# Patient Record
Sex: Female | Born: 1970 | Race: White | Hispanic: Yes | Marital: Married | Smoking: Never smoker
Health system: Southern US, Community
[De-identification: ages and names within clinical notes are randomized; demographics above are authoritative.]

## PROBLEM LIST (undated history)

## (undated) DIAGNOSIS — T7840XA Allergy, unspecified, initial encounter: Secondary | ICD-10-CM

## (undated) HISTORY — DX: Allergy, unspecified, initial encounter: T78.40XA

---

## 2009-05-11 ENCOUNTER — Inpatient Hospital Stay (HOSPITAL_COMMUNITY): Admission: AD | Admit: 2009-05-11 | Discharge: 2009-05-11 | Payer: Self-pay | Admitting: Obstetrics & Gynecology

## 2009-07-08 ENCOUNTER — Ambulatory Visit (HOSPITAL_COMMUNITY): Admission: RE | Admit: 2009-07-08 | Discharge: 2009-07-08 | Payer: Self-pay | Admitting: Obstetrics & Gynecology

## 2009-11-27 ENCOUNTER — Inpatient Hospital Stay (HOSPITAL_COMMUNITY): Admission: AD | Admit: 2009-11-27 | Discharge: 2009-11-27 | Payer: Self-pay | Admitting: Obstetrics & Gynecology

## 2009-11-27 ENCOUNTER — Ambulatory Visit: Payer: Self-pay | Admitting: Obstetrics and Gynecology

## 2009-12-01 ENCOUNTER — Inpatient Hospital Stay (HOSPITAL_COMMUNITY)
Admission: AD | Admit: 2009-12-01 | Discharge: 2009-12-03 | Payer: Self-pay | Source: Home / Self Care | Admitting: Obstetrics & Gynecology

## 2009-12-01 ENCOUNTER — Ambulatory Visit: Payer: Self-pay | Admitting: Family Medicine

## 2010-06-30 LAB — CBC
HCT: 31.1 % — ABNORMAL LOW (ref 36.0–46.0)
HCT: 40 % (ref 36.0–46.0)
Hemoglobin: 10.5 g/dL — ABNORMAL LOW (ref 12.0–15.0)
MCH: 29.9 pg (ref 26.0–34.0)
MCH: 30.5 pg (ref 26.0–34.0)
MCHC: 33.3 g/dL (ref 30.0–36.0)
MCV: 90.2 fL (ref 78.0–100.0)
RBC: 4.46 MIL/uL (ref 3.87–5.11)
RDW: 15.4 % (ref 11.5–15.5)
RDW: 15.8 % — ABNORMAL HIGH (ref 11.5–15.5)
WBC: 12 10*3/uL — ABNORMAL HIGH (ref 4.0–10.5)

## 2010-07-03 LAB — GC/CHLAMYDIA PROBE AMP, GENITAL: Chlamydia, DNA Probe: NEGATIVE

## 2010-07-03 LAB — POCT PREGNANCY, URINE: Preg Test, Ur: POSITIVE

## 2010-07-03 LAB — URINE MICROSCOPIC-ADD ON

## 2010-07-03 LAB — ABO/RH: ABO/RH(D): A POS

## 2010-07-03 LAB — WET PREP, GENITAL: Trich, Wet Prep: NONE SEEN

## 2010-07-03 LAB — CBC
Platelets: 237 10*3/uL (ref 150–400)
WBC: 11.4 10*3/uL — ABNORMAL HIGH (ref 4.0–10.5)

## 2010-07-03 LAB — URINALYSIS, ROUTINE W REFLEX MICROSCOPIC
Ketones, ur: NEGATIVE mg/dL
Nitrite: NEGATIVE
Protein, ur: NEGATIVE mg/dL

## 2013-07-02 ENCOUNTER — Ambulatory Visit: Payer: Self-pay | Admitting: Gynecology

## 2013-09-25 ENCOUNTER — Other Ambulatory Visit (HOSPITAL_COMMUNITY): Payer: Self-pay | Admitting: Physician Assistant

## 2013-09-25 DIAGNOSIS — Z1231 Encounter for screening mammogram for malignant neoplasm of breast: Secondary | ICD-10-CM

## 2013-10-05 ENCOUNTER — Ambulatory Visit (HOSPITAL_COMMUNITY)
Admission: RE | Admit: 2013-10-05 | Discharge: 2013-10-05 | Disposition: A | Discharge status: Home / Self Care | Payer: Self-pay | Source: Ambulatory Visit | Attending: Physician Assistant | Admitting: Physician Assistant

## 2013-10-05 DIAGNOSIS — Z1231 Encounter for screening mammogram for malignant neoplasm of breast: Secondary | ICD-10-CM

## 2014-12-09 ENCOUNTER — Other Ambulatory Visit: Payer: Self-pay | Admitting: Obstetrics and Gynecology

## 2014-12-09 DIAGNOSIS — Z1231 Encounter for screening mammogram for malignant neoplasm of breast: Secondary | ICD-10-CM

## 2014-12-30 ENCOUNTER — Encounter (HOSPITAL_COMMUNITY): Payer: Self-pay

## 2014-12-30 ENCOUNTER — Ambulatory Visit (HOSPITAL_COMMUNITY)
Admission: RE | Admit: 2014-12-30 | Discharge: 2014-12-30 | Disposition: A | Discharge status: Home / Self Care | Payer: Self-pay | Source: Ambulatory Visit | Attending: Obstetrics and Gynecology | Admitting: Obstetrics and Gynecology

## 2014-12-30 VITALS — BP 120/72 | Temp 98.2°F | Ht 60.0 in | Wt 190.0 lb

## 2014-12-30 DIAGNOSIS — Z1231 Encounter for screening mammogram for malignant neoplasm of breast: Secondary | ICD-10-CM

## 2014-12-30 DIAGNOSIS — Z1239 Encounter for other screening for malignant neoplasm of breast: Secondary | ICD-10-CM

## 2014-12-30 NOTE — Progress Notes (Signed)
CLINIC:  [facility] Civil engineer, contracting) Clinic  REASON FOR VISIT: Well-woman exam and screening mammogram.    HISTORY OF PRESENT ILLNESS:  Ms. Lynn Aguirre is a 44 y.o. female who presents to the [facility], [place] today for clinical breast exam. No family history of breast cancer. She has no complaints today.  Her last pap smear was in 09/2013 and was negative.  She has no history of abnormal pap smears.   REVIEW OF SYSTEMS:  Denies any breast pain, nodularity, nipple inversion, or nipple discharge bilaterally.   ALLERGIES: No Known Allergies  CURRENT MEDICATIONS:  No current outpatient prescriptions on file prior to encounter.   No current facility-administered medications on file prior to encounter.     PHYSICAL EXAM:  Vitals:  Filed Vitals:   12/30/14 1417  BP: 120/72  Temp: 98.2 F (36.8 C)   General: Well-nourished, well-appearing female in no acute distress.  She is unaccompanied in clinic today.  Stoney Bang, LPN and Milas Gain, Spanish language interpreter were present during physical exam for this patient.  Breasts: Bilateral breasts exposed and observed with patient standing (arms at side, arms on hips, arms on hips flexed forward, and arms over head).  No gross abnormalities including breast skin puckering or dimpling noted on observation.  Breasts symmetrical without evidence of skin redness, thickening, or peau d'orange appearance. No nipple retraction or nipple discharge noted bilaterally.  No breast nodularity palpated in bilateral breasts.  Normal fibrocystic breast changes noted in bilateral breasts. Axillary lymph nodes: No axillary lymphadenopathy bilaterally.   GU: Exam deferred. Pap smear is up-to-date.  ASSESSMENT & PLAN:   1. Breast cancer screening: Ms. Lynn Aguirre has no palpable breast abnormalities on her clinical breast exam today.  She will receive her screening mammogram as scheduled.  She will be contacted by the imaging center for  results of the mammogram, either by letter or phone within the next few weeks.  She was given instructions and educational materials regarding breast self-awareness. Ms. Lynn Aguirre is aware of this plan and agrees with it.    Ms. Lynn Aguirre was encouraged to ask questions and all questions were answered to her satisfaction.    Lubertha Basque, NP [facility]  [phone]

## 2015-01-20 ENCOUNTER — Ambulatory Visit: Payer: Self-pay

## 2015-02-28 ENCOUNTER — Emergency Department (HOSPITAL_COMMUNITY)
Admission: EM | Admit: 2015-02-28 | Discharge: 2015-03-01 | Disposition: A | Discharge status: Home / Self Care | Payer: 59 | Attending: Emergency Medicine | Admitting: Emergency Medicine

## 2015-02-28 ENCOUNTER — Encounter (HOSPITAL_COMMUNITY): Payer: Self-pay | Admitting: *Deleted

## 2015-02-28 ENCOUNTER — Emergency Department (HOSPITAL_COMMUNITY): Payer: 59

## 2015-02-28 DIAGNOSIS — J029 Acute pharyngitis, unspecified: Secondary | ICD-10-CM

## 2015-02-28 DIAGNOSIS — J069 Acute upper respiratory infection, unspecified: Secondary | ICD-10-CM | POA: Diagnosis not present

## 2015-02-28 LAB — RAPID STREP SCREEN (MED CTR MEBANE ONLY): STREPTOCOCCUS, GROUP A SCREEN (DIRECT): NEGATIVE

## 2015-02-28 MED ORDER — HYDROCODONE-ACETAMINOPHEN 7.5-325 MG/15ML PO SOLN: 15.0000 mL | Freq: Four times a day (QID) | ORAL | Status: AC | PRN

## 2015-02-28 NOTE — ED Notes (Signed)
Pt left with all her belongings and ambulated out of the treatment area.  

## 2015-02-28 NOTE — Discharge Instructions (Signed)
Faringitis (Pharyngitis) La faringitis ocurre cuando la faringe presenta enrojecimiento, dolor e hinchazn (inflamacin).  CAUSAS  Normalmente, la faringitis se debe a una infeccin. Generalmente, estas infecciones ocurren debido a virus (viral) y se presentan cuando las personas se resfran. Sin embargo, a Advertising account executive faringitis es provocada por bacterias (bacteriana). Las alergias tambin pueden ser una causa de la faringitis. La faringitis viral se puede contagiar de Neomia Dear persona a otra al toser, estornudar y compartir objetos o utensilios personales (tazas, tenedores, cucharas, cepillos de diente). La faringitis bacteriana se puede contagiar de Neomia Dear persona a otra a travs de un contacto ms ntimo, como besar.  SIGNOS Y SNTOMAS  Los sntomas de la faringitis incluyen los siguientes:   Dolor de Advertising copywriter.  Cansancio (fatiga).  Fiebre no muy elevada.  Dolor de [place].  Dolores musculares y en las articulaciones.  Erupciones cutneas  Ganglios linfticos hinchados.  Una pelcula parecida a las placas en la garganta o las amgdalas (frecuente con la faringitis bacteriana). DIAGNSTICO  El mdico le har preguntas sobre la enfermedad y sus sntomas. Normalmente, todo lo que se necesita para diagnosticar una faringitis son sus antecedentes mdicos y un examen fsico. A veces se realiza una prueba rpida para estreptococos. Tambin es posible que se realicen otros anlisis de laboratorio, segn la posible causa.  TRATAMIENTO  La faringitis viral normalmente mejorar en un plazo de 3 a 4das sin medicamentos. La faringitis bacteriana se trata con medicamentos que [employer] grmenes (antibiticos).  INSTRUCCIONES PARA EL CUIDADO EN EL HOGAR   Beba gran cantidad de lquido para mantener la orina de tono claro o color amarillo plido.  Tome solo medicamentos de venta libre o recetados, segn las indicaciones del mdico.  Si le receta antibiticos, asegrese de terminarlos, incluso si comienza  a Actor.  No tome aspirina.  Descanse lo suficiente.  Hgase grgaras con 8onzas ( ) de agua con sal (cucharadita de sal por litro de agua) cada 1 o 2horas para Science writer.  Puede usar pastillas (si no corre riesgo de Health visitor) o aerosoles para Science writer. SOLICITE ATENCIN MDICA SI:   Tiene bultos grandes y dolorosos en el cuello.  Tiene una erupcin cutnea.  Cuando tose elimina una expectoracin verde, amarillo amarronado o con Aneta. SOLICITE ATENCIN MDICA DE INMEDIATO SI:   El cuello se pone rgido.  Comienza a babear o no puede tragar lquidos.  Vomita o no puede retener los [employer] lquidos.  Siente un dolor intenso que no se alivia con los medicamentos recomendados.  Tiene dificultades para respirar (y no debido a la nariz tapada). ASEGRESE DE QUE:   Comprende estas instrucciones.  Controlar su afeccin.  Recibir ayuda de inmediato si no mejora o si empeora.   Esta informacin no tiene Theme park manager el consejo del mdico. Asegrese de hacerle al mdico cualquier pregunta que tenga.   Document Released: 01/10/2005 Document Revised: 01/21/2013 Elsevier Interactive Patient Education 2016 [employer].  Tos en los adultos (Cough, Adult) La tos es un reflejo que limpia la garganta y las vas respiratorias, y ayuda a la curacin y Training and development officer de los pulmones. Es normal toser de Teacher, English as a foreign language, pero cuando esta se presenta con otros sntomas o dura mucho tiempo puede ser el signo de una enfermedad que [place]. La tos puede durar solo 2 o 3semanas (aguda) o ms de 8semanas (crnica). CAUSAS Comnmente, las causas de la tos son las siguientes:  Visual merchandiser sustancias que [employer].  Una infeccin  respiratoria viral o bacteriana.  Alergias.  Asma.  Goteo posnasal.  Fumar.  El retroceso de cido estomacal hacia el esfago (reflujo gastroesofgico).  Algunos medicamentos.  Los  problemas pulmonares crnicos, entre ellos, la enfermedad pulmonar obstructiva crnica (EPOC) (o, en contadas ocasiones, el cncer de pulmn).  Otras afecciones, como la insuficiencia cardaca. INSTRUCCIONES PARA EL CUIDADO EN EL HOGAR  Est atento a cualquier cambio en los sntomas. Tome estas medidas para Paramedic las molestias:  Tome los medicamentos solamente como se lo haya indicado el mdico.  Si le recetaron un antibitico, tmelo como se lo haya indicado el mdico. No deje de tomar los antibiticos aunque comience a sentirse mejor.  Hable con el mdico antes de tomar un antitusivo.  Beba suficiente lquido para Photographer orina clara o de color amarillo plido.  Si el aire est seco, use un vaporizador o un humidificador con vapor fro en su habitacin o en su casa para ayudar a aflojar las secreciones.  Evite todas las cosas que le producen tos en el trabajo o en su casa.  Si la tos aumenta durante la noche, intente dormir semisentado.  Evite el humo del cigarrillo. Si fuma, deje de hacerlo. Si necesita ayuda para dejar de fumar, consulte al mdico.  Evite la cafena.  Evite el alcohol.  Descanse todo lo que sea necesario. SOLICITE ATENCIN MDICA SI:   Aparecen nuevos sntomas.  Expectora pus al toser.  La tos no mejora despus de 2 o 3semanas, o empeora.  No puede controlar la tos con antitusivos y no puede dormir bien.  Tiene un dolor que se intensifica o que no puede Sales promotion account executive.  Tiene fiebre.  Baja de peso sin causa aparente.  Tiene transpiracin nocturna. SOLICITE ATENCIN MDICA DE INMEDIATO SI:  Tose y escupe sangre.  Tiene dificultad para respirar.  Los latidos cardacos son muy rpidos.   Esta informacin no tiene Theme park manager el consejo del mdico. Asegrese de hacerle al mdico cualquier pregunta que tenga.   Document Released: 11/08/2010 Document Revised: 12/22/2014 Elsevier Interactive Patient Education 2016  [employer].  Dolor de garganta  (Sore Throat)  El dolor de garganta es el dolor, ardor, irritacin o sensacin de picazn en la garganta. Generalmente hay dolor o molestias al tragar o hablar. Un dolor de garganta puede estar acompaado de otros sntomas, como tos, estornudos, fiebre y ganglios hinchados en el cuello. Generalmente es Financial risk analyst signo de otra enfermedad, como un resfrio, gripe, anginas o mononucleosis (conocida como mono). La mayor parte de los dolores de garganta desaparecen sin tratamiento mdico. CAUSAS  Las causas ms comunes de dolor de garganta son:   Infecciones virales, como un resfrio, gripe o mononucleosis.  Infeccin bacteriana, como faringitis estreptoccica, amigdalitis, o tos ferina.  Alergias estacionales.  La sequedad en el aire.  Algunos irritantes, como el humo o la polucin.  Reflujo gastroesofgico. INSTRUCCIONES PARA EL CUIDADO EN EL HOGAR   Tome slo la medicacin que le indic el mdico.  Debe ingerir gran cantidad de lquido para mantener la orina de tono claro o color amarillo plido.  Descanse todo lo que sea necesario.  Trate de usar [employer] para la garganta, pastillas o chupe caramelos duros para Engineer, materials (si es mayor de 4 aos o segn lo que le indiquen).  Beba lquidos calientes, como caldos, infusiones de hierbas o agua caliente con miel para calmar el dolor momentneamente. Tambin puede comer o beber lquidos fros o congelados tales como paletas de hielo congelado.  Haga grgaras con agua con sal (mezclar 1 cucharadita de sal en 8 onzas [250 cm3] de agua).  No fume, y evite el humo de otros fumadores.  Ponga un humidificador de vapor fro en la habitacin por la noche para humedecer el aire. Tambin se puede activar en una ducha de agua caliente y sentarse en el bao con la puerta cerrada durante 5-10 minutos. SOLICITE ATENCIN MDICA DE INMEDIATO SI:   Tiene dificultad para respirar.  No puede tragar lquidos,  alimentos blandos, o su saliva.  Usted tiene ms inflamacin en la garganta.  El dolor de garganta no mejora en [address].  Tiene nuseas o vmitos.  Tiene fiebre o sntomas que persisten durante ms de 2 o 3 das.  Tiene fiebre y los sntomas empeoran de manera sbita. ASEGRESE DE QUE:   Comprende estas instrucciones.  Controlar su enfermedad.  Solicitar ayuda de inmediato si no mejora o si empeora.   Esta informacin no tiene Theme park manager el consejo del mdico. Asegrese de hacerle al mdico cualquier pregunta que tenga.   Document Released: 04/02/2005 Document Revised: 03/19/2012 Elsevier Interactive Patient Education 2016 [employer].  Infeccin del tracto respiratorio superior, adultos (Upper Respiratory Infection, Adult) La mayora de las infecciones del tracto respiratorio superior son infecciones virales de las vas que llevan el aire a los pulmones. Un infeccin del tracto respiratorio superior afecta la nariz, la garganta y las vas respiratorias superiores. El tipo ms frecuente de infeccin del tracto respiratorio superior es la nasofaringitis, que habitualmente se conoce como "resfro comn". Las infecciones del tracto respiratorio superior siguen su curso y por lo general se curan solas. En la [employer], la infeccin del tracto respiratorio superior no requiere atencin [place], Biomedical engineer a veces, despus de una infeccin viral, puede surgir una infeccin bacteriana en las vas respiratorias superiores. Esto se conoce como infeccin secundaria. Las infecciones sinusales y en el odo medio son tipos frecuentes de infecciones secundarias en el tracto respiratorio superior. La neumona bacteriana tambin puede complicar un cuadro de infeccin del tracto respiratorio superior. Este tipo de infeccin puede empeorar el asma y la enfermedad pulmonar obstructiva crnica (EPOC). En algunos casos, estas complicaciones pueden requerir atencin mdica de emergencia y poner  en peligro la vida.  CAUSAS Casi todas las infecciones del tracto respiratorio superior se deben a los virus. Un virus es un tipo de microbio que puede contagiarse de Neomia Dear persona a Educational psychologist.  FACTORES DE RIESGO Puede estar en riesgo de sufrir una infeccin del tracto respiratorio superior si:   Fuma.  Tiene una enfermedad pulmonar o cardaca crnica.  Tiene debilitado el sistema de defensa (inmunitario) del cuerpo.  Es [address] o de edad muy [place].  Tiene asma o alergias nasales.  Trabaja en reas donde hay mucha gente o poca ventilacin.  Rudi Coco en una escuela o en un centro de atencin mdica. SIGNOS Y SNTOMAS  Habitualmente, los sntomas aparecen de 2a 3das despus de entrar en contacto con el virus del resfro. La mayora de las infecciones virales en el tracto respiratorio superior duran de 7a 10das. Sin embargo, las infecciones virales en el tracto respiratorio superior a causa del virus de la gripe pueden durar de 14a 18das y, habitualmente, son ms graves. Entre los sntomas se pueden incluir los siguientes:   Secrecin o congestin nasal.  Estornudos.  Tos.  Dolor de Advertising copywriter.  Dolor de [place].  Fatiga.  Grant Ruts.  Prdida del apetito.  Dolor en la frente, detrs de los ojos  y por encima de los pmulos (dolor sinusal).  Dolores musculares. DIAGNSTICO  El mdico puede diagnosticar una infeccin del tracto respiratorio superior mediante los siguientes estudios:  Examen fsico.  Pruebas para verificar si los sntomas no se deben a otra afeccin, por ejemplo:  Faringitis estreptoccica.  Sinusitis.  Neumona.  Asma. TRATAMIENTO  Esta infeccin desaparece sola, con el tiempo. No puede curarse con medicamentos, pero a menudo se prescriben para aliviar los sntomas. Los medicamentos pueden ser tiles para lo siguiente:  Personal assistant fiebre.  Reducir la tos.  Aliviar la congestin nasal. INSTRUCCIONES PARA EL CUIDADO EN EL HOGAR   Tome los  medicamentos solamente como se lo haya indicado el mdico.  A fin de Engineer, materials de garganta, haga grgaras con solucin salina templada o consuma caramelos para la tos, como se lo haya indicado el mdico.  Use un humidificador de vapor clido o inhale el vapor de la ducha para aumentar la humedad del aire. Esto facilitar la respiracin.  Beba suficiente lquido para Photographer orina clara o de color amarillo plido.  Consuma sopas y otros caldos transparentes, y [employer].  Descanse todo lo que sea necesario.  Regrese al Aleen Campi cuando la temperatura se le haya normalizado o cuando el mdico lo autorice. Es posible que deba quedarse en su casa durante un tiempo prolongado, para no infectar a los dems. Tambin puede usar un barbijo y lavarse las manos con cuidado para Transport planner propagacin del virus.  Aumente el uso del inhalador si tiene asma.  No consuma ningn producto que contenga tabaco, lo que incluye cigarrillos, tabaco de Theatre manager o Administrator, Civil Service. Si necesita ayuda para dejar de fumar, consulte al [employer]. PREVENCIN  La mejor manera de protegerse de un resfro es mantener una higiene [place].   Evite el contacto oral o fsico con personas que tengan sntomas de resfro.  En caso de contacto, lvese las manos con frecuencia. No hay pruebas claras de que la vitaminaC, la vitaminaE, la equincea o el ejercicio reduzcan la probabilidad de Primary school teacher un resfro. Sin embargo, siempre se recomienda Insurance account manager, hacer ejercicio y Engineering geologist.  SOLICITE ATENCIN MDICA SI:   Su estado empeora en lugar de mejorar.  Los medicamentos no Estate agent.  Tiene escalofros.  La sensacin de falta de aire empeora.  Tiene mucosidad marrn o roja.  Tiene secrecin nasal amarilla o marrn.  Le duele la cara, especialmente al inclinarse hacia adelante.  Tiene fiebre.  Tiene los ganglios del cuello hinchados.  Siente dolor al  tragar.  Tiene zonas blancas en la parte de atrs de la garganta. SOLICITE ATENCIN MDICA DE INMEDIATO SI:   Tiene sntomas intensos o persistentes de:  Dolor de [place].  Dolor de odos.  Dolor sinusal.  Dolor en el pecho.  Tiene enfermedad pulmonar crnica y cualquiera de estos sntomas:  Sibilancias.  Tos prolongada.  Tos con sangre.  Cambio en la mucosidad habitual.  Presenta rigidez en el cuello.  Tiene cambios en:  La visin.  La audicin.  El pensamiento.  El [place] de nimo. ASEGRESE DE QUE:   Comprende estas instrucciones.  Controlar su afeccin.  Recibir ayuda de inmediato si no mejora o si empeora.   Esta informacin no tiene Theme park manager el consejo del mdico. Asegrese de hacerle al mdico cualquier pregunta que tenga.  ESTABLISH CARE WITH A PRIMARY CARE PROVIDER. RETURN TO THE EMERGENCY DEPARTMENT IF YOU EXPERIENCE WORSENING OF YOUR SYMPTOMS, SEVERE HEADACHE, DIFFICULTY BREATHING OR SWALLOWING, VOMITING,  FEVER.

## 2015-02-28 NOTE — ED Notes (Signed)
Since last nite developed fever and started coughing.  Pt states it was making it hard for her to breath.  Pt states throat is hurting.  Pt states her ears are clogged up.

## 2015-03-02 LAB — CULTURE, GROUP A STREP: STREP A CULTURE: NEGATIVE

## 2015-03-03 NOTE — ED Provider Notes (Signed)
CSN: [id number]     Arrival date & time 02/28/15  1727 History   First MD Initiated Contact with Patient 02/28/15 2118     Chief Complaint  Patient presents with  . Cough  . Sore Throat     (Consider location/radiation/quality/duration/timing/severity/associated sxs/prior Treatment) Patient is a 44 y.o. female presenting with cough and pharyngitis. The history is provided by the patient and a relative. The history is limited by a language barrier (Patient's son translated for patient).  Cough Associated symptoms: rhinorrhea   Sore Throat Associated symptoms include coughing.     Lynn Aguirre is a 44 y.o F with no significant pmhx who presents to the Ed today c/o subjective fevers, sore throat, cough onset last night. Patient states that she has not taken her temperature at home but feels that she is warm to the touch. Patient also complaining of sore throat. No difficulty swallowing. However, when she coughs the pain in her throat is increased. Cough is nonproductive. No difficulty breathing or shortness of breath. Denies vomiting, diarrhea, chills, dizziness, syncope, chest pain, headache.  History reviewed. No pertinent past medical history. Past Surgical History  Procedure Laterality Date  . Cesarean section     Family History  Problem Relation Age of Onset  . Diabetes Father    Social History  Substance Use Topics  . Smoking status: Never Smoker   . Smokeless tobacco: Never Used  . Alcohol Use: No   OB History    Gravida Para Term Preterm AB TAB SAB Ectopic Multiple Living   4 4 4       4      Review of Systems  HENT: Positive for rhinorrhea. Negative for facial swelling, mouth sores, postnasal drip, sinus pressure and voice change.   Respiratory: Positive for cough.   All other systems reviewed and are negative.     Allergies  Review of patient's allergies indicates no known allergies.  Home Medications   Prior to Admission medications   Medication Sig  Start Date End Date Taking? Authorizing Provider  loratadine (CLARITIN) 10 MG tablet Take 10 mg by mouth daily as needed for allergies.   Yes Historical Provider, MD  HYDROcodone-acetaminophen (HYCET) 7.5-325 mg/15 ml solution Take 15 mLs by mouth 4 (four) times daily as needed for moderate pain. 02/28/15 02/28/16  Estreya Clay Tripp Bronislaw Switzer, PA-C   BP 115/79 mmHg  Pulse 87  Temp(Src) 99 F (37.2 C) (Oral)  Resp 14  SpO2 96% Physical Exam  Constitutional: She is oriented to person, place, and time. She appears well-developed and well-nourished. No distress.  HENT:  Head: Normocephalic and atraumatic.  Nose: Nose normal.  Mouth/Throat: Oropharynx is clear and moist. No oropharyngeal exudate.  TMs clear bilaterally.  Eyes: Conjunctivae and EOM are normal. Pupils are equal, round, and reactive to light. Right eye exhibits no discharge. Left eye exhibits no discharge. No scleral icterus.  Neck: Neck supple.  Cardiovascular: Normal rate, regular rhythm, normal heart sounds and intact distal pulses.  Exam reveals no gallop and no friction rub.   No murmur heard. Pulmonary/Chest: Effort normal and breath sounds normal. No stridor. No respiratory distress. She has no wheezes. She has no rales. She exhibits no tenderness.  Abdominal: Soft. She exhibits no distension. There is no tenderness. There is no rebound and no guarding.  Musculoskeletal: Normal range of motion. She exhibits no edema.  Lymphadenopathy:    She has no cervical adenopathy.  Neurological: She is alert and oriented to person, place, and time. No  cranial nerve deficit.  Strength 5/5 throughout. No sensory deficits.    Skin: Skin is warm and dry. No rash noted. She is not diaphoretic. No erythema. No pallor.  Psychiatric: She has a normal mood and affect. Her behavior is normal.  Nursing note and vitals reviewed.   ED Course  Procedures (including critical care time) Labs Review Labs Reviewed  RAPID STREP SCREEN (NOT AT [facility])   CULTURE, GROUP A STREP    Imaging Review No results found. I have personally reviewed and evaluated these images and lab results as part of my medical decision-making.   EKG Interpretation None      MDM   Final diagnoses:  Viral pharyngitis  Viral URI    Otherwise healthy 44 year old female Presents for 1 day history of sore throat, cough, subjective fevers. Vital signs stable in the emergency department. Afebrile. Rapid strep screen negative. Does not meet any Centor criteria. Chest x-ray negative for infection. Lungs clear to auscultation bilaterally. Patient able to eat and drink appropriately. No difficulty swallowing or breathing. Suspect viral URI/viral pharyngitis. Recommend symptomatic treatment including rest, hydration, NSAIDs as needed for pain. Patient given strict return precautions which are outlined in the discharge instructions.  Encourage patient to establish care with a primary care physician. Patient has insurance coverage.     Lester Kinsman Ingalls, PA-C 03/03/15 1610  Mancel Bale, MD 03/03/15 [phone]

## 2015-03-14 ENCOUNTER — Ambulatory Visit: Payer: Self-pay | Admitting: Family Medicine

## 2015-09-19 ENCOUNTER — Ambulatory Visit (INDEPENDENT_AMBULATORY_CARE_PROVIDER_SITE_OTHER): Payer: 59 | Admitting: Physician Assistant

## 2015-09-19 VITALS — BP 122/76 | HR 74 | Temp 98.3°F | Resp 16 | Ht 60.0 in | Wt 200.0 lb

## 2015-09-19 DIAGNOSIS — J069 Acute upper respiratory infection, unspecified: Secondary | ICD-10-CM

## 2015-09-19 MED ORDER — IBUPROFEN 600 MG PO TABS: 600.0000 mg | ORAL_TABLET | Freq: Three times a day (TID) | ORAL | Status: DC | PRN

## 2015-09-19 MED ORDER — CETIRIZINE-PSEUDOEPHEDRINE ER 5-120 MG PO TB12: 1.0000 | ORAL_TABLET | Freq: Two times a day (BID) | ORAL | Status: DC

## 2015-09-19 MED ORDER — HYDROCODONE-HOMATROPINE 5-1.5 MG/5ML PO SYRP: 2.5000 mL | ORAL_SOLUTION | Freq: Every evening | ORAL | Status: DC | PRN

## 2015-09-19 NOTE — Progress Notes (Signed)
09/19/2015 8:27 PM   DOB: [birth year] / MRN: [id number]  SUBJECTIVE:  Lynn Aguirre is a 45 y.o. female presenting for sneezing, coughing, itchy and watery eyes that started 3 days ago.  She associates dry and scratchy throat. Denies fever, chills and nausea.  She is not getting better or worse.  She has tried benadryl and theraflu, both offered fair relief.   She has No Known Allergies.   She  has no past medical history on file.    She  reports that she has never smoked. She has never used smokeless tobacco. She reports that she does not drink alcohol or use illicit drugs. She  reports that she currently engages in sexual activity. She reports using the following methods of birth control/protection: None and Condom. The patient  has past surgical history that includes Cesarean section.  Her family history includes Diabetes in her father.  Review of Systems  Constitutional: Negative for fever and chills.  Eyes: Negative for blurred vision.  Respiratory: Negative for cough and shortness of breath.   Cardiovascular: Negative for chest pain.  Gastrointestinal: Negative for nausea and abdominal pain.  Genitourinary: Negative for dysuria, urgency and frequency.  Musculoskeletal: Negative for myalgias.  Skin: Negative for rash.  Neurological: Negative for dizziness, tingling and headaches.  Psychiatric/Behavioral: Negative for depression. The patient is not nervous/anxious.     Problem list and medications reviewed and updated by myself where necessary, and exist elsewhere in the encounter.   OBJECTIVE:  BP 122/76 mmHg  Pulse 74  Temp(Src) 98.3 F (36.8 C) (Oral)  Resp 16  Ht 5' (1.524 m)  Wt 200 lb (90.719 kg)  BMI 39.06 kg/m2  SpO2 97%  LMP 09/13/2015  Physical Exam  Constitutional: She is oriented to person, place, and time. She appears well-nourished. No distress.  HENT:  Nose: Mucosal edema (beefy red) present. No rhinorrhea. Right sinus exhibits no maxillary sinus  tenderness and no frontal sinus tenderness. Left sinus exhibits no maxillary sinus tenderness and no frontal sinus tenderness.  Eyes: EOM are normal. Pupils are equal, round, and reactive to light.  Cardiovascular: Normal rate and regular rhythm.  Exam reveals no friction rub.   No murmur heard. Pulmonary/Chest: Effort normal. No respiratory distress. She has no wheezes.  Abdominal: She exhibits no distension.  Neurological: She is alert and oriented to person, place, and time. No cranial nerve deficit. Gait normal.  Skin: Skin is dry. She is not diaphoretic.  Psychiatric: She has a normal mood and affect.  Vitals reviewed.   No results found for this or any previous visit (from the past 72 hour(s)).  No results found.  ASSESSMENT AND PLAN  Crystalle was seen today for cough, ear pain, sinusitis and sore throat.  Diagnoses and all orders for this visit:  URI (upper respiratory infection): HPI and exam consistent with a viral URI. Will treat to that end.   -     cetirizine-pseudoephedrine (ZYRTEC-D ALLERGY & CONGESTION) 5-120 MG tablet; Take 1 tablet by mouth 2 (two) times daily. -     ibuprofen (ADVIL,MOTRIN) 600 MG tablet; Take 1 tablet (600 mg total) by mouth every 8 (eight) hours as needed. -     HYDROcodone-homatropine (HYCODAN) 5-1.5 MG/5ML syrup; Take 2.5-5 mLs by mouth at bedtime as needed.    The patient was advised to call or return to clinic if she does not see an improvement in symptoms or to seek the care of the closest emergency department if she worsens with  the above plan.   Deliah BostonMichael Terence Bart, MHS, PA-C [facility] 09/19/2015 8:27 PM

## 2015-09-19 NOTE — Patient Instructions (Signed)
     IF you received an x-ray today, you will receive an invoice from [facility]. Please contact [facility] at [phone] with questions or concerns regarding your invoice.   IF you received labwork today, you will receive an invoice from [facility]. Please contact [facility] at [phone] with questions or concerns regarding your invoice.   Our billing staff will not be able to assist you with questions regarding bills from these companies.  You will be contacted with the lab results as soon as they are available. The fastest way to get your results is to activate your My Chart account. Instructions are located on the last page of this paperwork. If you have not heard from us regarding the results in 2 weeks, please contact this office.      

## 2015-12-16 ENCOUNTER — Other Ambulatory Visit: Payer: Self-pay | Admitting: Physician Assistant

## 2015-12-16 DIAGNOSIS — Z1231 Encounter for screening mammogram for malignant neoplasm of breast: Secondary | ICD-10-CM

## 2016-01-06 ENCOUNTER — Ambulatory Visit
Admission: RE | Admit: 2016-01-06 | Discharge: 2016-01-06 | Disposition: A | Discharge status: Home / Self Care | Payer: 59 | Source: Ambulatory Visit | Attending: Physician Assistant | Admitting: Physician Assistant

## 2016-01-06 DIAGNOSIS — Z1231 Encounter for screening mammogram for malignant neoplasm of breast: Secondary | ICD-10-CM

## 2016-07-13 ENCOUNTER — Ambulatory Visit (INDEPENDENT_AMBULATORY_CARE_PROVIDER_SITE_OTHER): Payer: 59 | Admitting: Family Medicine

## 2016-07-13 VITALS — BP 126/80 | HR 77 | Temp 98.1°F | Resp 16 | Ht 60.0 in | Wt 203.0 lb

## 2016-07-13 DIAGNOSIS — B86 Scabies: Secondary | ICD-10-CM | POA: Diagnosis not present

## 2016-07-13 MED ORDER — PERMETHRIN 5 % EX CREA: 1.0000 "application " | TOPICAL_CREAM | Freq: Once | CUTANEOUS | 0 refills | Status: DC

## 2016-07-13 NOTE — Progress Notes (Signed)
Chief Complaint  Patient presents with  . Rash    itchy all over x 3 wks, took son to Dr. on 07/12/16 diag with scabies    HPI New Problem- Rash Patient reports an itchy rash for 3 weeks She took her son to the doctor yesterday and he was diagnosed with Scabies She states that she is itchy at night Also notes that she has been itchy on her scalp, neck, under her breast In her creases She takes zyrtec without improvement She denies other symptom   No fevers or chills  No past medical history on file.  Current Outpatient Prescriptions  Medication Sig Dispense Refill  . cetirizine-pseudoephedrine (ZYRTEC-D ALLERGY & CONGESTION) 5-120 MG tablet Take 1 tablet by mouth 2 (two) times daily. 14 tablet 0  . HYDROcodone-homatropine (HYCODAN) 5-1.5 MG/5ML syrup Take 2.5-5 mLs by mouth at bedtime as needed. (Patient not taking: Reported on 07/13/2016) 60 mL 0  . ibuprofen (ADVIL,MOTRIN) 600 MG tablet Take 1 tablet (600 mg total) by mouth every 8 (eight) hours as needed. (Patient not taking: Reported on 07/13/2016) 30 tablet 0  . loratadine (CLARITIN) 10 MG tablet Take 10 mg by mouth daily as needed for allergies. Reported on 09/19/2015    . permethrin (ELIMITE) 5 % cream Apply 1 application topically once. Apply to scalp and then from neck down. Rinse off in 10-12 hours. Repeat in one week 60 g 0   No current facility-administered medications for this visit.     Allergies: No Known Allergies  Past Surgical History:  Procedure Laterality Date  . CESAREAN SECTION      Social History   Social History  . Marital status: Married    Spouse name: N/A  . Number of children: N/A  . Years of education: N/A   Social History Main Topics  . Smoking status: Never Smoker  . Smokeless tobacco: Never Used  . Alcohol use No  . Drug use: No  . Sexual activity: Yes    Birth control/ protection: None, Condom   Other Topics Concern  . None   Social History Narrative  . None    Review of  Systems  Constitutional: Negative for chills and fever.  Respiratory: Negative for cough and wheezing.   Cardiovascular: Negative for chest pain and palpitations.  Skin: Positive for itching and rash.    Objective: Vitals:   07/13/16 0920  BP: 126/80  Pulse: 77  Resp: 16  Temp: 98.1 F (36.7 C)  TempSrc: Oral  SpO2: 100%  Weight: 203 lb (92.1 kg)  Height: 5' (1.524 m)    Physical Exam  Constitutional: She is oriented to person, place, and time. She appears well-developed.  HENT:  Head: Normocephalic and atraumatic.  Cardiovascular: Normal rate, regular rhythm and normal heart sounds.   No murmur heard. Pulmonary/Chest: Effort normal and breath sounds normal. No respiratory distress. She has no wheezes.  Neurological: She is alert and oriented to person, place, and time.  Skin: Capillary refill takes less than 2 seconds.  Excoriation noted on torso Small papular lesions under breast, on neck, torso, wrists  Psychiatric: She has a normal mood and affect. Her behavior is normal. Judgment and thought content normal.    Assessment and Plan Janaysia was seen today for rash.  Diagnoses and all orders for this visit:  Scabies- classic skin findings and history of exposure Explained treatment Gave instructions in english and spanish Discussed cleaning and laundry to prevent reinfection  Other orders -  permethrin (ELIMITE) 5 % cream; Apply 1 application topically once. Apply to scalp and then from neck down. Rinse off in 10-12 hours. Repeat in one week     Jaray Boliver A Creta Levin

## 2016-07-13 NOTE — Patient Instructions (Addendum)
How to use Permethrin massage permethrin cream thoroughly into the skin from the neck to the soles of the feet, including areas under the fingernails and toenails. Also apply to scalp. Avoid the eyes. Permethrin should be removed by washing (shower or bath) after 8 to 14 hours. Treatment is often performed overnight.   Repeat in one week from the neck down.  ------------------------------------------------------------------------------------------------------  Cmo usar Permethrin masajear la crema de permetrina a fondo en la piel desde el cuello hasta las plantas de los pies, incluidas las reas debajo de las uas de las manos y los pies. Tambin aplica al cuero cabelludo. Evita los ojos La permetrina debe eliminarse mediante lavado (ducha o bao) despus de 8 a 14 horas. El tratamiento a menudo se realiza durante la noche. Repita en una semana desde el cuello hacia abajo.     IF you received an x-ray today, you will receive an invoice from [facility]. Please contact [facility] at [phone] with questions or concerns regarding your invoice.   IF you received labwork today, you will receive an invoice from [facility]. Please contact [facility] at [phone] with questions or concerns regarding your invoice.   Our billing staff will not be able to assist you with questions regarding bills from these companies.  You will be contacted with the lab results as soon as they are available. The fastest way to get your results is to activate your My Chart account. Instructions are located on the last page of this paperwork. If you have not heard from [place] regarding the results in 2 weeks, please contact this office.     Sarna en los adultos (Scabies, Adult) La sarna es una afeccin cutnea que se produce cuando insectos muy pequeos se introducen debajo de la piel (infestacin). Esto causa una erupcin cutnea y picazn intensa. La sarna puede transmitirse de Neomia Dear persona a otra (es  contagiosa). Si una persona tiene sarna, es frecuente que los dems miembros de la familia tambin contraigan la afeccin. Los sntomas suelen desaparecer en el trmino de 2 a 4semanas con el tratamiento adecuado. Por lo general, la sarna no causa problemas crnicos. CAUSAS La causa de esta afeccin son los caros (Sarcoptes scabiei o aradores de la sarna) que pueden verse solamente con un microscopio. Los caros se introducen en la capa superior de la piel y ponen [place]. La sarna puede transmitirse de Neomia Dear persona a otra a travs de lo siguiente:  El contacto cercano con una persona que tiene sarna.  El contacto con objetos infestados, como [place], ropa de [place] o prendas de vestir. FACTORES DE RIESGO Es ms probable que esta afeccin se manifieste en:  Las personas que viven en hogares de ancianos y centros de cuidados prolongados.  Las personas que tienen contacto sexual con un compaero que tiene sarna.  Los nios pequeos que asisten a guarderas.  Las [employer] cuidan a Economist con un riesgo mayor de Warehouse manager sarna. SNTOMAS Los sntomas de esta afeccin pueden incluir lo siguiente:  Picazn intensa. La picazn generalmente empeora por la noche.  Una erupcin cutnea con pequeos bultos rojos o ampollas. La erupcin cutnea suele aparecer en la mueca, el codo, la axila, los dedos de la mano, la cintura, la ingle o los glteos. Los bultos pueden formar una lnea (surco) en algunas zonas.  Irritacin de la piel. Esta puede incluir lceras o manchas escamosas. DIAGNSTICO Esta afeccin se diagnostica mediante un examen fsico. El mdico le examinar exhaustivamente la piel. En algunos casos, el  mdico puede tomar una muestra de la piel afectada (raspado de la piel) y la har examinar con un microscopio. TRATAMIENTO El tratamiento de esta afeccin puede incluir lo siguiente:  [place]a Lo[place] o locin con un medicamento para destruir los caros. Este producto se esparce por todo el  cuerpo y se deja durante varias horas. Por lo general, un tratamiento con crema o locin con medicamento es suficiente para destruir todos los caros. Cuando los casos son graves, puede que sea necesario repetir Scientist, research (medical).  Crema con medicamento para Associate Professor.  Medicamentos que ayudan a Associate Professor.  Medicamentos que [employer] caros. Este tratamiento se utiliza en contadas ocasiones. INSTRUCCIONES PARA EL CUIDADO EN EL HOGAR Medicamentos  Tome o aplquese los medicamentos de venta libre y los recetados como se lo haya indicado el mdico.  Aplique la crema o locin con medicamento como se lo haya indicado el mdico.  No enjuague la crema o locin con medicamento hasta tanto haya transcurrido el tiempo necesario. Cuidado de la piel  No se rasque la piel afectada.  Mantenga bien cortas las uas de las manos para reducir las lesiones que se producen al rascarse.  Para aliviar la picazn, tome baos fros o aplquese paos fros en la piel. Instrucciones generales  [employer] con los que haya tenido contacto reciente, entre ellos, la ropa de [place], las prendas de vestir y [place]. Haga esto el mismo da que comience el [place].  Lave los objetos con agua caliente.  Coloque en bolsas de plstico hermticas durante al menos 3das los objetos que no se puedan lavar. Los caros no sobreviven ms de 3das alejados de la [employer].  Pase la aspiradora por los muebles y los colchones que [place].  Asegrese de que un mdico examine a las dems personas que puedan haberse infestado. Esto incluye a los miembros de su familia y a Emergency planning/management officer que pueda haber tenido contacto con los objetos infestados.  Concurra a todas las visitas de control como se lo haya indicado el mdico. Esto es importante. SOLICITE ATENCIN MDICA SI:  La picazn no desaparece despus de 4semanas de tratamiento.  Le siguen apareciendo nuevos bultos o  surcos.  Tiene enrojecimiento, hinchazn o dolor en la zona de la erupcin cutnea despus del tratamiento.  Observa lquido, sangre o pus que salen de la erupcin cutnea. Esta informacin no tiene Theme park manager el consejo del mdico. Asegrese de hacerle al mdico cualquier pregunta que tenga. Document Released: 12/22/2014 Document Revised: 12/22/2014 Document Reviewed: 11/02/2014 Elsevier Interactive Patient Education  2017 [employer].

## 2016-07-20 ENCOUNTER — Other Ambulatory Visit: Payer: Self-pay | Admitting: Family Medicine

## 2016-12-03 ENCOUNTER — Other Ambulatory Visit: Payer: Self-pay | Admitting: Physician Assistant

## 2016-12-03 DIAGNOSIS — Z1231 Encounter for screening mammogram for malignant neoplasm of breast: Secondary | ICD-10-CM

## 2016-12-31 ENCOUNTER — Ambulatory Visit (INDEPENDENT_AMBULATORY_CARE_PROVIDER_SITE_OTHER): Payer: 59 | Admitting: Physician Assistant

## 2016-12-31 ENCOUNTER — Encounter: Payer: Self-pay | Admitting: Physician Assistant

## 2016-12-31 VITALS — BP 115/76 | HR 81 | Temp 98.2°F | Resp 16 | Ht 60.0 in | Wt 209.0 lb

## 2016-12-31 DIAGNOSIS — R0789 Other chest pain: Secondary | ICD-10-CM

## 2016-12-31 DIAGNOSIS — R112 Nausea with vomiting, unspecified: Secondary | ICD-10-CM | POA: Diagnosis not present

## 2016-12-31 MED ORDER — OMEPRAZOLE 40 MG PO CPDR: 40.0000 mg | DELAYED_RELEASE_CAPSULE | Freq: Every day | ORAL | 3 refills | Status: DC

## 2016-12-31 NOTE — Progress Notes (Signed)
PRIMARY CARE AT [facility] [address], [place] [address] [phone]  Date:  12/31/2016   Name:  Lynn Aguirre   DOB:  1971-02-17   MRN:  [id number]  PCP:  Patient, No Pcp Per    History of Present Illness:  Lynn Aguirre is a 46 y.o. female patient who presents to PCP with  Chief Complaint  Patient presents with  . Heartburn    after pt eats/ burning feeling goes to her back at times  . Gastroesophageal Reflux    pt states she feels like vomiting after eating, but nothing comes up     2 weeks ago, she was having chest pains that radiated through to her back.  When shee eats, there is pain.  The pain is described as a pressure, and feels like she is missing air.  This generally asssociated with after eating.  She has some nausea.  The pain is constant and all day.  Not associated with moving or exertion.  No coughing.  No sob or dizziness.  When the pain is very intense, she feels like she can not breathe.   No black or bloody stool.   Fruits, like pineapples, will aggravates the pain. She is eating tomato-based foods.   Fried foods. Occasional soda.   EtOH: none.  No nsaid use. Wt Readings from Last 3 Encounters:  12/31/16 209 lb (94.8 kg)  07/13/16 203 lb (92.1 kg)  09/19/15 200 lb (90.7 kg)    Patient was diagnosed with esophagitis.  There are no active problems to display for this patient.   No past medical history on file.  Past Surgical History:  Procedure Laterality Date  . CESAREAN SECTION      Social History  Substance Use Topics  . Smoking status: Never Smoker  . Smokeless tobacco: Never Used  . Alcohol use No    Family History  Problem Relation Age of Onset  . Diabetes Father     No Known Allergies  Medication list has been reviewed and updated.  Current Outpatient Prescriptions on File Prior to Visit  Medication Sig Dispense Refill  . cetirizine-pseudoephedrine (ZYRTEC-D ALLERGY & CONGESTION) 5-120 MG tablet Take 1 tablet by mouth 2  (two) times daily. (Patient not taking: Reported on 12/31/2016) 14 tablet 0  . loratadine (CLARITIN) 10 MG tablet Take 10 mg by mouth daily as needed for allergies. Reported on 09/19/2015    . permethrin (ELIMITE) 5 % cream APPLY TOPICALLY TO SCALP THEN NECK DOWN. RINSE OFF IN 10-12 HOURS AND REPEAT IN 1 WEEK (Patient not taking: Reported on 12/31/2016) 60 g 1   No current facility-administered medications on file prior to visit.     ROS ROS otherwise unremarkable unless listed above.  Physical Examination: BP 115/76   Pulse 81   Temp 98.2 F (36.8 C) (Oral)   Resp 16   Ht 5' (1.524 m)   Wt 209 lb (94.8 kg)   LMP 12/13/2016   SpO2 95%   BMI 40.82 kg/m  Ideal Body Weight: Weight in (lb) to have BMI = 25: 127.7  Physical Exam  Constitutional: She is oriented to person, place, and time. She appears well-developed and well-nourished. No distress.  HENT:  Head: Normocephalic and atraumatic.  Right Ear: External ear normal.  Left Ear: External ear normal.  Eyes: Pupils are equal, round, and reactive to light. Conjunctivae and EOM are normal.  Cardiovascular: Normal rate, regular rhythm and normal heart sounds.  Exam reveals no gallop and  no friction rub.   No murmur heard. Pulses:      Radial pulses are 2+ on the right side, and 2+ on the left side.       Dorsalis pedis pulses are 2+ on the right side, and 2+ on the left side.  Pulmonary/Chest: Effort normal. No respiratory distress.  Neurological: She is alert and oriented to person, place, and time.  Skin: She is not diaphoretic.  Psychiatric: She has a normal mood and affect. Her behavior is normal.     Assessment and Plan: Lynn Aguirre is a 46 y.o. female who is here today for cc of chest pain.  This is likely her GERD.  Given restricted diet. ekg is reassuring. Advised to follow up in 4 weeks. Nausea and vomiting, intractability of vomiting not specified, unspecified vomiting type - Plan: H. pylori breath test,  omeprazole (PRILOSEC) 40 MG capsule  Other chest pain - Plan: EKG 12-Lead, Lipase, CBC, H. pylori breath test, omeprazole (PRILOSEC) 40 MG capsule  Lynn Platt, PA-C [facility] 9/20/20187:33 AM

## 2016-12-31 NOTE — Patient Instructions (Addendum)
Opciones de alimentos para pacientes con reflujo gastroesofgico - Adultos (Food Choices for Gastroesophageal Reflux Disease, Adult) Cuando se tiene reflujo gastroesofgico (ERGE), los alimentos que se ingieren y los hbitos de alimentacin son muy importantes. Elegir los alimentos adecuados puede ayudar a [employer]. QU PAUTAS DEBO SEGUIR?  Elija las frutas, los vegetales, los cereales integrales y los productos lcteos con bajo contenido de [place].  Elija las carnes de [place], de pescado y de ave con bajo contenido de grasas.  Limite las grasas, [address] [place], los aderezos para [place], la [place], los frutos secos y Programme researcher, broadcasting/film/video.  Lleve un registro de alimentos. Esto ayuda a identificar los alimentos que ocasionan sntomas.  Evite los alimentos que le ocasionen sntomas. Pueden ser distintos para cada persona.  Haga comidas pequeas durante Glass blower/designer de 3 comidas abundantes.  Coma lentamente, en un lugar donde est distendido.  Limite el consumo de alimentos fritos.  Cocine los alimentos utilizando mtodos que no sean la fritura.  Evite el consumo alcohol.  Evite beber grandes cantidades de lquidos con las comidas.  Evite agacharse o recostarse hasta despus de 2 o 3horas de haber comido.  QU ALIMENTOS NO SE RECOMIENDAN? Estos son algunos alimentos y bebidas que pueden empeorar los sntomas: Veterinary surgeon. Jugo de tomate. Salsa de tomate y espagueti. Ajes. Cebolla y [place]. Rbano picante. Frutas Naranjas, pomelos y limn (fruta y [place]). Carnes Carnes de [place], de pescado y de ave con gran contenido de grasas. Esto incluye los perros calientes, las [place], el [place], la salchicha, el salame y el tocino. Lcteos Leche entera y [place] Neighbors. [employer]. Crema. [place]. Helados. Queso crema. Bebidas T o caf. Bebidas gaseosas o bebidas energizantes. Condimentos Salsa picante. Salsa barbacoa. Dulces/postres Chocolate y cacao.  [place]. Menta y mentol. Grasas y [employer]. Esto incluye las papas fritas. Otros Vin[place]. Especias picantes. Esto incluye la pimienta negra, la pimienta blanca, la pimienta roja, la pimienta de cayena, el curry en polvo, los clavos de [place], el jengibre y el [place] en polvo. Esta no es [employer] de los alimentos y las bebidas que se Theatre stage manager. Comunquese con el nutricionista para recibir ms informacin. Esta informacin no tiene Theme park manager el consejo del mdico. Asegrese de hacerle al mdico cualquier pregunta que tenga. Document Released: 10/02/2011 Document Revised: 04/23/2014 Document Reviewed: 02/04/2013 Elsevier Interactive Patient Education  2017 Elsevier Inc.   Enfermedad por reflujo gastroesofgico en los adultos (Gastroesophageal Reflux Disease, Adult) Normalmente, los alimentos descienden por el esfago y se depositan en el estmago para su digestin. Si una persona tiene enfermedad por reflujo gastroesofgico (ERGE), los alimentos y el cido estomacal regresan al esfago. Cuando esto ocurre, el esfago se irrita y se hincha (inflama). Con el tiempo, la ERGE puede provocar la formacin de pequeas perforaciones (lceras) en la mucosa del esfago. CUIDADOS EN EL HOGAR Dieta  Siga la dieta como se lo haya indicado el mdico. Tal vez deba evitar los siguientes alimentos y bebidas: ? Caf y t (con o sin cafena). ? Bebidas que contengan alcohol. ? Bebidas energizantes y deportivas. ? Gaseosas o refrescos. ? Chocolate y cacao. ? Menta y esencias de [address] ? Ajo y cebollas. ? Rbano picante. ? Alimentos muy condimentados y cidos, como pimientos, [place] en polvo, curry en polvo, vinagre, salsas picantes y Engineer, water. ? Frutas ctricas y sus jugos, como naranjas, limones y limas. ? Alimentos a base de tomates, como salsa roja, [place], salsa y pizza con  salsa roja. ? Alimentos fritos y Lexicographer, como rosquillas, papas fritas y aderezos con  alto contenido de Holiday representative. ? Carnes con alto contenido de [place], como hot dogs, filetes de entrecot, salchicha, jamn y tocino. ? Productos lcteos con alto contenido de grasa, como [place], [place] y [place] crema.  Consuma pequeas porciones de comida con ms frecuencia. Evite consumir porciones abundantes.  Evite beber mucho lquido con las comidas.  No coma durante las 2 o 3horas previas a la hora de [place].  No se acueste inmediatamente despus de comer.  No haga actividad fsica enseguida despus de comer. Instrucciones generales  Est atento a cualquier cambio en los sntomas.  Tome los medicamentos de venta libre y los recetados solamente como se lo haya indicado el mdico. No tome aspirina, ibuprofeno ni otros antiinflamatorios no esteroides (AINE), a menos que el mdico lo autorice.  No consuma ningn producto que contenga tabaco, lo que incluye cigarrillos, tabaco de Theatre manager y Administrator, Civil Service. Si necesita ayuda para dejar de fumar, consulte al mdico.  Use ropa suelta. No use nada ajustado alrededor Reynolds American.  Levante (eleve) unas 6pulgadas (15centmetros) la cabecera de la cama.  Intente bajar el nivel de estrs. Si necesita ayuda para hacerlo, consulte al [employer] Express.  Si tiene sobrepeso, Media planner un peso saludable. Pregntele a su mdico cmo puede perder peso de manera segura.  Concurra a todas las visitas de control como se lo haya indicado el mdico. Esto es importante. SOLICITE AYUDA SI:  Aparecen nuevos sntomas.  Baja de [place] y no sabe por qu.  Tiene dificultad para tragar o siente dolor al [employer] Restaurants.  Tiene sibilancias o tos que no desaparece.  Los sntomas no mejoran con Scientist, research (medical).  Tiene la voz ronca. SOLICITE AYUDA DE INMEDIATO SI:  Tiene dolor en los brazos, el cuello, los [place], la dentadura o la espalda.  Berenice Primas, se marea o tiene sensacin de desvanecimiento.  Siente falta de aire o Engineer, manufacturing.  Vomita y el vmito es parecido a la sangre o a los granos de caf.  Pierde el conocimiento (se desmaya).  Las heces son sanguinolentas o de color negro.  No puede tragar, beber o comer. Esta informacin no tiene Theme park manager el consejo del mdico. Asegrese de hacerle al mdico cualquier pregunta que tenga. Document Released: 05/05/2010 Document Revised: 12/22/2014 Document Reviewed: 07/28/2014 Elsevier Interactive Patient Education  2018 [employer].   IF you received an x-ray today, you will receive an invoice from [facility]. Please contact [facility] at [phone] with questions or concerns regarding your invoice.   IF you received labwork today, you will receive an invoice from [place]. Please contact [facility] at [phone] with questions or concerns regarding your invoice.   Our billing staff will not be able to assist you with questions regarding bills from these companies.  You will be contacted with the lab results as soon as they are available. The fastest way to get your results is to activate your My Chart account. Instructions are located on the last page of this paperwork. If you have not heard from [place] regarding the results in 2 weeks, please contact this office.

## 2017-01-01 ENCOUNTER — Encounter: Payer: Self-pay | Admitting: *Deleted

## 2017-01-01 LAB — CBC
Hematocrit: 39.3 % (ref 34.0–46.6)
Hemoglobin: 12.4 g/dL (ref 11.1–15.9)
MCH: 26.7 pg (ref 26.6–33.0)
MCHC: 31.6 g/dL (ref 31.5–35.7)
MCV: 85 fL (ref 79–97)
PLATELETS: 297 10*3/uL (ref 150–379)
RBC: 4.65 x10E6/uL (ref 3.77–5.28)
RDW: 15.2 % (ref 12.3–15.4)
WBC: 10.6 10*3/uL (ref 3.4–10.8)

## 2017-01-01 LAB — H. PYLORI BREATH TEST: H PYLORI BREATH TEST: NEGATIVE

## 2017-01-01 LAB — LIPASE: Lipase: 35 U/L (ref 14–72)

## 2017-01-03 ENCOUNTER — Encounter: Payer: Self-pay | Admitting: Physician Assistant

## 2017-01-07 ENCOUNTER — Ambulatory Visit
Admission: RE | Admit: 2017-01-07 | Discharge: 2017-01-07 | Disposition: A | Discharge status: Home / Self Care | Payer: 59 | Source: Ambulatory Visit | Attending: Physician Assistant | Admitting: Physician Assistant

## 2017-01-07 DIAGNOSIS — Z1231 Encounter for screening mammogram for malignant neoplasm of breast: Secondary | ICD-10-CM

## 2017-01-28 ENCOUNTER — Ambulatory Visit (INDEPENDENT_AMBULATORY_CARE_PROVIDER_SITE_OTHER): Payer: 59 | Admitting: Physician Assistant

## 2017-01-28 VITALS — BP 124/82 | HR 88 | Temp 98.4°F | Resp 16 | Ht 60.0 in | Wt 207.0 lb

## 2017-01-28 DIAGNOSIS — R631 Polydipsia: Secondary | ICD-10-CM | POA: Diagnosis not present

## 2017-01-28 DIAGNOSIS — H6993 Unspecified Eustachian tube disorder, bilateral: Secondary | ICD-10-CM

## 2017-01-28 DIAGNOSIS — K219 Gastro-esophageal reflux disease without esophagitis: Secondary | ICD-10-CM | POA: Diagnosis not present

## 2017-01-28 DIAGNOSIS — H6983 Other specified disorders of Eustachian tube, bilateral: Secondary | ICD-10-CM

## 2017-01-28 DIAGNOSIS — J302 Other seasonal allergic rhinitis: Secondary | ICD-10-CM | POA: Diagnosis not present

## 2017-01-28 DIAGNOSIS — Z23 Encounter for immunization: Secondary | ICD-10-CM

## 2017-01-28 LAB — GLUCOSE, POCT (MANUAL RESULT ENTRY): POC Glucose: 87 mg/dl (ref 70–99)

## 2017-01-28 MED ORDER — CETIRIZINE HCL 10 MG PO TABS: 10.0000 mg | ORAL_TABLET | Freq: Every day | ORAL | 11 refills | Status: DC

## 2017-01-28 MED ORDER — PREDNISONE 20 MG PO TABS: 40.0000 mg | ORAL_TABLET | Freq: Every day | ORAL | 0 refills | Status: DC

## 2017-01-28 MED ORDER — FLUTICASONE PROPIONATE 50 MCG/ACT NA SUSP: 2.0000 | Freq: Every day | NASAL | 5 refills | Status: DC

## 2017-01-28 NOTE — Patient Instructions (Addendum)
  Please STOP the claritin-D.  Take the zyrtec at this time.  And use the flonase.  If your symptoms are not improving within the 2 days.   I am giving you the prednisone temporarily for 4 days.  Take as prescribed.    Barotitis media ([employer]) La barotitis media es la inflamacin del odo medio. Se produce cuando el conducto auditivo (trompa de [place]) que une la parte posterior de la nariz (nasofaringe) con el tmpano, se obstruye. Esta obstruccin puede ser causada por un resfro, alergias ambientales o una infeccin en las vas respiratorias superiores. La barotitis media que no se cura puede llevar a una lesin o a la prdida auditiva barotrauma), que Sales executive a ser [employer] Nextel [employer]. INSTRUCCIONES PARA EL CUIDADO EN EL HOGAR  Tome todos los medicamentos como le indic el mdico. Los medicamentos de venta libre podrn ayudar a Advertising account planner la obstruccin del canal y a Air traffic controller el pasaje de Soil scientist.  No se introduzca nada en el odo para limpiarlo o destaparlo. Las gotas ticas no lo ayudarn.  No practique natacin ni buceo ni viaje en avin hasta que su mdico le diga que puede [place]. Si realizar estas actividades fueran necesario, puede ser til la goma de [place], que lo har tragar con frecuencia. Tambin lo ayudar si se oprime la Darene Lamer y sopla suavemente hasta destaparse los odos para equilibrar los cambios de presin. Esto fuerza el aire en las trompas de [place].  Slo tome medicamentos de venta libre o recetados para Primary school teacher, Environmental health practitioner o bajar la [place], segn las indicaciones de su mdico.  Un descongestivo puede ayudarlo a Education administrator el odo medio y Radio producer ms fcil el equilibrio de la presin.  SOLICITE ATENCIN MDICA SI:  Experimenta alguna forma de mareo grave, en el que siente como si la habitacin le diera vueltas y tiene nuseas (vrtigo).  Los sntomas slo involucran un odo.  SOLICITE ATENCIN MDICA DE INMEDIATO SI:  Siente un fuerte dolor de  cabeza, mareos o dolor intenso en el odo.  Tiene un drenaje sanguinolento o similar a pus por el odo.  Tiene fiebre.  Los sntomas no mejoran o empeoran.  ASEGRESE DE QUE:  Comprende estas instrucciones.  Controlar su afeccin.  Recibir ayuda de inmediato si no mejora o si empeora.  Esta informacin no tiene Theme park manager el consejo del mdico. Asegrese de hacerle al mdico cualquier pregunta que tenga. Document Released: 04/02/2005 Document Revised: 04/07/2013 Document Reviewed: 10/28/2012 Elsevier Interactive Patient Education  2017 [employer].    IF you received an x-ray today, you will receive an invoice from [facility]. Please contact [facility] at [phone] with questions or concerns regarding your invoice.   IF you received labwork today, you will receive an invoice from [place]. Please contact [facility] at [phone] with questions or concerns regarding your invoice.   Our billing staff will not be able to assist you with questions regarding bills from these companies.  You will be contacted with the lab results as soon as they are available. The fastest way to get your results is to activate your My Chart account. Instructions are located on the last page of this paperwork. If you have not heard from [place] regarding the results in 2 weeks, please contact this office.

## 2017-01-28 NOTE — Progress Notes (Signed)
PRIMARY CARE AT [facility] [address], [place] [address] [phone]  Date:  01/28/2017   Name:  Lynn Aguirre   DOB:  02-Oct-1970   MRN:  [id number]  PCP:  Patient, No Pcp Per    History of Present Illness:  Lynn Aguirre is a 46 y.o. female patient who presents to PCP with  Chief Complaint  Patient presents with  . Follow-up    GERD/ pt states she feels better. pt states her throat is dry and she is thristy more often     Patient is here for follow up of GERD.  She was seen here 4 weeks ago for nausea and vomiting.  Placed on omeprazole daily and GERD diet. She reports that her symptoms are improving.  She has no nausea.  She is watching her intake.  She reports that she is feeling new dry throat and feels incredible thirst at times.  She reports that she does have allergies.  She takes Claritin D for the last 3 weeks.  She reports she may feel a slight heart flutter at times now.  No chest pains, sob, diaphoresis, or dizziness. Wt Readings from Last 3 Encounters:  01/28/17 207 lb (93.9 kg)  12/31/16 209 lb (94.8 kg)  07/13/16 203 lb (92.1 kg)     There are no active problems to display for this patient.   No past medical history on file.  Past Surgical History:  Procedure Laterality Date  . CESAREAN SECTION      Social History  Substance Use Topics  . Smoking status: Never Smoker  . Smokeless tobacco: Never Used  . Alcohol use No    Family History  Problem Relation Age of Onset  . Diabetes Father     No Known Allergies  Medication list has been reviewed and updated.  Current Outpatient Prescriptions on File Prior to Visit  Medication Sig Dispense Refill  . loratadine (CLARITIN) 10 MG tablet Take 10 mg by mouth daily as needed for allergies. Reported on 09/19/2015    . omeprazole (PRILOSEC) 40 MG capsule Take 1 capsule (40 mg total) by mouth daily. 30 capsule 3  . simethicone (MYLANTA GAS) 125 MG chewable tablet Chew 125 mg by mouth every 6 (six)  hours as needed for flatulence.     No current facility-administered medications on file prior to visit.     ROS ROS otherwise unremarkable unless listed above.  Physical Examination: BP 124/82   Pulse 88   Temp 98.4 F (36.9 C) (Oral)   Resp 16   Ht 5' (1.524 m)   Wt 207 lb (93.9 kg)   SpO2 96%   BMI 40.43 kg/m  Ideal Body Weight: Weight in (lb) to have BMI = 25: 127.7  Physical Exam  Constitutional: She is oriented to person, place, and time. She appears well-developed and well-nourished. No distress.  HENT:  Head: Normocephalic and atraumatic.  Right Ear: External ear and ear canal normal. A middle ear effusion (both non purulent) is present.  Left Ear: External ear and ear canal normal. A middle ear effusion is present.  Mouth/Throat: Oropharynx is clear and moist. No oropharyngeal exudate, posterior oropharyngeal edema or posterior oropharyngeal erythema.  Eyes: Pupils are equal, round, and reactive to light. Conjunctivae and EOM are normal.  Cardiovascular: Normal rate.   Pulmonary/Chest: Effort normal. No respiratory distress.  Neurological: She is alert and oriented to person, place, and time.  Skin: She is not diaphoretic.  Psychiatric: She has a normal  mood and affect. Her behavior is normal.    Results for orders placed or performed in visit on 01/28/17  POCT glucose (manual entry)  Result Value Ref Range   POC Glucose 87 70 - 99 mg/dl     Assessment and Plan: Lynn Aguirre is a 46 y.o. female who is here today for cc of  Chief Complaint  Patient presents with  . Follow-up    GERD/ pt states she feels better. pt states her throat is dry and she is thristy more often   Advised to switch to zyrtec without D, likely causing minor palpitations.  Recent EKG did not reveal abnormal findings.  Advised to get refill from pharmacy of the PPI.  Also given Flonase to open up eustachian tubes.  Gastroesophageal reflux disease, esophagitis presence not  specified  Dysfunction of both eustachian tubes - Plan: fluticasone (FLONASE) 50 MCG/ACT nasal spray, cetirizine (ZYRTEC) 10 MG tablet, predniSONE (DELTASONE) 20 MG tablet  Seasonal allergies - Plan: fluticasone (FLONASE) 50 MCG/ACT nasal spray, cetirizine (ZYRTEC) 10 MG tablet, predniSONE (DELTASONE) 20 MG tablet  Polydipsia - Plan: POCT glucose (manual entry), fluticasone (FLONASE) 50 MCG/ACT nasal spray, CANCELED: Pap IG w/ reflex to HPV when ASC-U  Need for influenza vaccination - Plan: Flu Vaccine QUAD 36+ mos IM  Trena Platt, PA-C [facility] 10/16/20188:50 AM

## 2017-01-29 ENCOUNTER — Encounter: Payer: Self-pay | Admitting: Physician Assistant

## 2017-01-29 DIAGNOSIS — J302 Other seasonal allergic rhinitis: Secondary | ICD-10-CM | POA: Insufficient documentation

## 2017-01-29 DIAGNOSIS — K219 Gastro-esophageal reflux disease without esophagitis: Secondary | ICD-10-CM | POA: Insufficient documentation

## 2017-03-12 ENCOUNTER — Encounter: Payer: Self-pay | Admitting: Emergency Medicine

## 2017-03-12 ENCOUNTER — Ambulatory Visit: Payer: 59 | Admitting: Emergency Medicine

## 2017-03-12 VITALS — BP 130/80 | HR 73 | Temp 98.6°F | Resp 17 | Ht 63.0 in | Wt 211.0 lb

## 2017-03-12 DIAGNOSIS — R0981 Nasal congestion: Secondary | ICD-10-CM | POA: Insufficient documentation

## 2017-03-12 DIAGNOSIS — J0101 Acute recurrent maxillary sinusitis: Secondary | ICD-10-CM | POA: Diagnosis not present

## 2017-03-12 DIAGNOSIS — J3489 Other specified disorders of nose and nasal sinuses: Secondary | ICD-10-CM | POA: Insufficient documentation

## 2017-03-12 MED ORDER — TRIAMCINOLONE ACETONIDE 55 MCG/ACT NA AERO
2.0000 | INHALATION_SPRAY | Freq: Every day | NASAL | 12 refills | Status: DC
Start: 2017-03-12 — End: 2019-07-08

## 2017-03-12 MED ORDER — AMOXICILLIN-POT CLAVULANATE 875-125 MG PO TABS: 1.0000 | ORAL_TABLET | Freq: Two times a day (BID) | ORAL | 0 refills | Status: AC

## 2017-03-12 NOTE — Patient Instructions (Addendum)
     IF you received an x-ray today, you will receive an invoice from [facility]. Please contact  Radiology at [phone] with questions or concerns regarding your invoice.   IF you received labwork today, you will receive an invoice from [facility]. Please contact [facility] at [phone] with questions or concerns regarding your invoice.   Our billing staff will not be able to assist you with questions regarding bills from these companies.  You will be contacted with the lab results as soon as they are available. The fastest way to get your results is to activate your My Chart account. Instructions are located on the last page of this paperwork. If you have not heard from us regarding the results in 2 weeks, please contact this office.     Sinusitis, Adult Sinusitis is soreness and inflammation of your sinuses. Sinuses are hollow spaces in the bones around your face. They are located:  Around your eyes.  In the middle of your forehead.  Behind your nose.  In your cheekbones.  Your sinuses and nasal passages are lined with a stringy fluid (mucus). Mucus normally drains out of your sinuses. When your nasal tissues get inflamed or swollen, the mucus can get trapped or blocked so air cannot flow through your sinuses. This lets bacteria, viruses, and funguses grow, and that leads to infection. Follow these instructions at home: Medicines  Take, use, or apply over-the-counter and prescription medicines only as told by your doctor. These may include nasal sprays.  If you were prescribed an antibiotic medicine, take it as told by your doctor. Do not stop taking the antibiotic even if you start to feel better. Hydrate and Humidify  Drink enough water to keep your pee (urine) clear or pale yellow.  Use a cool mist humidifier to keep the humidity level in your home above 50%.  Breathe in steam for 10-15 minutes, 3-4 times a day or as told by your doctor. You can do  this in the bathroom while a hot shower is running.  Try not to spend time in cool or dry air. Rest  Rest as much as possible.  Sleep with your head raised (elevated).  Make sure to get enough sleep each night. General instructions  Put a warm, moist washcloth on your face 3-4 times a day or as told by your doctor. This will help with discomfort.  Wash your hands often with soap and water. If there is no soap and water, use hand sanitizer.  Do not smoke. Avoid being around people who are smoking (secondhand smoke).  Keep all follow-up visits as told by your doctor. This is important. Contact a doctor if:  You have a fever.  Your symptoms get worse.  Your symptoms do not get better within 10 days. Get help right away if:  You have a very bad headache.  You cannot stop throwing up (vomiting).  You have pain or swelling around your face or eyes.  You have trouble seeing.  You feel confused.  Your neck is stiff.  You have trouble breathing. This information is not intended to replace advice given to you by your health care provider. Make sure you discuss any questions you have with your health care provider. Document Released: 09/19/2007 Document Revised: 11/27/2015 Document Reviewed: 01/26/2015 Elsevier Interactive Patient Education  2018 Elsevier Inc.  

## 2017-03-12 NOTE — Progress Notes (Signed)
Lynn MaineMaria O Aguirre [age] y.o.   Chief Complaint  Patient presents with  . Sore Throat  . Sinusitis    HISTORY OF PRESENT ILLNESS: This is a 46 y.o. female complaining of chronic sinus symptoms x several months on and off. Mostly c/o sinus congestion/pressure.  Sinus Problem  This is a recurrent problem. The current episode started in the past 7 days. The problem has been gradually worsening since onset. There has been no fever. The pain is moderate. Associated symptoms include congestion, headaches, sinus pressure and a sore throat. Pertinent negatives include no chills, coughing, ear pain, neck pain, shortness of breath or swollen glands.     Prior to Admission medications   Medication Sig Start Date End Date Taking? Authorizing Provider  cetirizine (ZYRTEC) 10 MG tablet Take 1 tablet (10 mg total) by mouth daily. 01/28/17   Trena PlattEnglish, Stephanie D, PA  fluticasone (FLONASE) 50 MCG/ACT nasal spray Place 2 sprays into both nostrils daily. 01/28/17   Trena PlattEnglish, Stephanie D, PA  loratadine (CLARITIN) 10 MG tablet Take 10 mg by mouth daily as needed for allergies. Reported on 09/19/2015    [provider]  omeprazole (PRILOSEC) 40 MG capsule Take 1 capsule (40 mg total) by mouth daily. 12/31/16   Garnetta BuddyEnglish, Stephanie D, PA  predniSONE (DELTASONE) 20 MG tablet Take 2 tablets (40 mg total) by mouth daily with breakfast. 01/28/17   Trena PlattEnglish, Stephanie D, PA  simethicone (MYLANTA GAS) 125 MG chewable tablet Chew 125 mg by mouth every 6 (six) hours as needed for flatulence.    [provider]    No Known Allergies  Patient Active Problem List   Diagnosis Date Noted  . Gastroesophageal reflux disease 01/29/2017  . Seasonal allergies 01/29/2017    No past medical history on file.  Past Surgical History:  Procedure Laterality Date  . CESAREAN SECTION      Social History   Socioeconomic History  . Marital status: Married    Spouse name: Not on file  . Number of children: Not on  file  . Years of education: Not on file  . Highest education level: Not on file  Social Needs  . Financial resource strain: Not on file  . Food insecurity - worry: Not on file  . Food insecurity - inability: Not on file  . Transportation needs - medical: Not on file  . Transportation needs - non-medical: Not on file  Occupational History  . Not on file  Tobacco Use  . Smoking status: Never Smoker  . Smokeless tobacco: Never Used  Substance and Sexual Activity  . Alcohol use: No  . Drug use: No  . Sexual activity: Yes    Birth control/protection: None, Condom  Other Topics Concern  . Not on file  Social History Narrative  . Not on file    Family History  Problem Relation Age of Onset  . Diabetes Father      Review of Systems  Constitutional: Negative for chills and fever.  HENT: Positive for congestion, sinus pressure and sore throat. Negative for ear pain and nosebleeds.   Eyes: Negative.  Negative for discharge and redness.  Respiratory: Negative for cough, hemoptysis, shortness of breath and wheezing.   Cardiovascular: Negative for chest pain, palpitations and leg swelling.  Gastrointestinal: Negative for abdominal pain, diarrhea, nausea and vomiting.  Genitourinary: Negative for dysuria and hematuria.  Musculoskeletal: Negative for neck pain.  Skin: Negative for rash.  Neurological: Positive for headaches.  All other systems reviewed and are  negative.     Vitals:   03/12/17 0828  BP: 130/80  Pulse: 73  Resp: 17  Temp: 98.6 F (37 C)  SpO2: 98%    Physical Exam  Constitutional: She is oriented to person, place, and time. She appears well-developed and well-nourished.  HENT:  Head: Normocephalic and atraumatic.  Right Ear: Tympanic membrane and ear canal normal.  Left Ear: Tympanic membrane and ear canal normal.  Nose: Mucosal edema present. Right sinus exhibits maxillary sinus tenderness. Left sinus exhibits maxillary sinus tenderness.  Mouth/Throat:  Uvula is midline and mucous membranes are normal. No uvula swelling. Posterior oropharyngeal erythema present. No oropharyngeal exudate.  Eyes: EOM are normal. Pupils are equal, round, and reactive to light.  Neck: Normal range of motion. Neck supple.  Cardiovascular: Normal rate, regular rhythm and normal heart sounds.  Pulmonary/Chest: Effort normal and breath sounds normal.  Abdominal: Soft. There is no tenderness.  Neurological: She is alert and oriented to person, place, and time. No sensory deficit. She exhibits normal muscle tone.  Skin: Skin is warm and dry. Capillary refill takes less than 2 seconds.  Psychiatric: She has a normal mood and affect. Her behavior is normal.  Vitals reviewed.    ASSESSMENT & PLAN: Gila was seen today for sore throat and sinusitis.  Diagnoses and all orders for this visit:  Sinus congestion  Sinus pressure  Acute recurrent maxillary sinusitis  Other orders -     amoxicillin-clavulanate (AUGMENTIN) 875-125 MG tablet; Take 1 tablet by mouth 2 (two) times daily for 7 days. -     triamcinolone (NASACORT) 55 MCG/ACT AERO nasal inhaler; Place 2 sprays into the nose daily.    Patient Instructions       IF you received an x-ray today, you will receive an invoice from [facility]. Please contact [facility] at [phone] with questions or concerns regarding your invoice.   IF you received labwork today, you will receive an invoice from [facility]. Please contact [facility] at [phone] with questions or concerns regarding your invoice.   Our billing staff will not be able to assist you with questions regarding bills from these companies.  You will be contacted with the lab results as soon as they are available. The fastest way to get your results is to activate your My Chart account. Instructions are located on the last page of this paperwork. If you have not heard from [place] regarding the results in 2 weeks, please contact this  office.      Sinusitis, Adult Sinusitis is soreness and inflammation of your sinuses. Sinuses are hollow spaces in the bones around your face. They are located:  Around your eyes.  In the middle of your forehead.  Behind your nose.  In your cheekbones.  Your sinuses and nasal passages are lined with a stringy fluid (mucus). Mucus normally drains out of your sinuses. When your nasal tissues get inflamed or swollen, the mucus can get trapped or blocked so air cannot flow through your sinuses. This lets bacteria, viruses, and funguses grow, and that leads to infection. Follow these instructions at home: Medicines  Take, use, or apply over-the-counter and prescription medicines only as told by your doctor. These may include nasal sprays.  If you were prescribed an antibiotic medicine, take it as told by your doctor. Do not stop taking the antibiotic even if you start to feel better. Hydrate and Humidify  Drink enough water to keep your pee (urine) clear or pale yellow.  Use a cool  mist humidifier to keep the humidity level in your home above 50%.  Breathe in steam for 10-15 minutes, 3-4 times a day or as told by your doctor. You can do this in the bathroom while a hot shower is running.  Try not to spend time in cool or dry air. Rest  Rest as much as possible.  Sleep with your head raised (elevated).  Make sure to get enough sleep each night. General instructions  Put a warm, moist washcloth on your face 3-4 times a day or as told by your doctor. This will help with discomfort.  Wash your hands often with soap and water. If there is no soap and water, use hand sanitizer.  Do not smoke. Avoid being around people who are smoking (secondhand smoke).  Keep all follow-up visits as told by your doctor. This is important. Contact a doctor if:  You have a fever.  Your symptoms get worse.  Your symptoms do not get better within 10 days. Get help right away if:  You have a  very bad headache.  You cannot stop throwing up (vomiting).  You have pain or swelling around your face or eyes.  You have trouble seeing.  You feel confused.  Your neck is stiff.  You have trouble breathing. This information is not intended to replace advice given to you by your health care provider. Make sure you discuss any questions you have with your health care provider. Document Released: 09/19/2007 Document Revised: 11/27/2015 Document Reviewed: 01/26/2015 Elsevier Interactive Patient Education  2018 Elsevier Inc.      Edwina BarthMiguel Annistyn Depass, MD [facility]

## 2017-03-27 ENCOUNTER — Ambulatory Visit: Payer: 59 | Admitting: Emergency Medicine

## 2017-03-27 ENCOUNTER — Other Ambulatory Visit: Payer: Self-pay

## 2017-03-27 ENCOUNTER — Encounter: Payer: Self-pay | Admitting: Emergency Medicine

## 2017-03-27 VITALS — BP 100/70 | HR 79 | Temp 97.6°F | Resp 16 | Ht 60.0 in | Wt 208.0 lb

## 2017-03-27 DIAGNOSIS — N898 Other specified noninflammatory disorders of vagina: Secondary | ICD-10-CM

## 2017-03-27 DIAGNOSIS — B3731 Acute candidiasis of vulva and vagina: Secondary | ICD-10-CM | POA: Insufficient documentation

## 2017-03-27 DIAGNOSIS — B373 Candidiasis of vulva and vagina: Secondary | ICD-10-CM | POA: Diagnosis not present

## 2017-03-27 MED ORDER — FLUCONAZOLE 150 MG PO TABS: 150.0000 mg | ORAL_TABLET | Freq: Once | ORAL | 0 refills | Status: AC

## 2017-03-27 NOTE — Patient Instructions (Addendum)
   IF you received an x-ray today, you will receive an invoice from [facility]. Please contact  Radiology at [phone] with questions or concerns regarding your invoice.   IF you received labwork today, you will receive an invoice from [facility]. Please contact [facility] at [phone] with questions or concerns regarding your invoice.   Our billing staff will not be able to assist you with questions regarding bills from these companies.  You will be contacted with the lab results as soon as they are available. The fastest way to get your results is to activate your My Chart account. Instructions are located on the last page of this paperwork. If you have not heard from us regarding the results in 2 weeks, please contact this office.     Candidiasis vaginal en los adultos (Gastrointestinal Yeast Infection, Adult) La candidiasis vaginal es una afeccin que causa dolor, hinchazn y enrojecimiento (inflamacin) de la vagina. Tambin causa secrecin vaginal. Esta es una enfermedad frecuente. Algunas mujeres contraen esta infeccin con frecuencia. CAUSAS La causa de la infeccin es un cambio en el equilibrio normal de los hongos (cndida) y las bacterias que viven en la vagina. Esta alteracin deriva en el crecimiento excesivo de los hongos, lo que causa la inflamacin. FACTORES DE RIESGO Es ms probable que esta afeccin se manifieste en:  Las mujeres que toman antibiticos.  Las mujeres que tienen diabetes.  Las mujeres que toman anticonceptivos.  Las mujeres que estn embarazadas.  Las mujeres que se hacen duchas vaginales con frecuencia.  Las mujeres que tienen un sistema de defensa (inmunitario) dbil.  Las mujeres que han tomado corticoides durante mucho tiempo.  Las mujeres que usan ropa ajustada con frecuencia. SNTOMAS Los sntomas de esta afeccin incluyen lo siguiente:  Secrecin vaginal blanca y espesa.  Hinchazn, picazn, enrojecimiento e  irritacin de la vagina. Los labios de la vagina (vulva) tambin se pueden infectar.  Dolor o ardor al orinar.  Dolor durante las relaciones sexuales. DIAGNSTICO Esta afeccin se diagnostica mediante la historia clnica y un examen fsico. Este incluye un examen plvico. El mdico examinar una muestra de la secrecin vaginal con un microscopio. Probablemente el mdico enve esta muestra al laboratorio para analizarla y confirmar el diagnstico. TRATAMIENTO Esta afeccin se trata con medicamentos. Los medicamentos pueden ser recetados o de venta libre. Podrn indicarle que use uno o ms de lo siguiente:  Medicamentos por va oral.  Medicamentos que se aplican como una crema.  Medicamentos que se colocan directamente en la vagina (vulos vaginales). INSTRUCCIONES PARA EL CUIDADO EN EL HOGAR  Tome o aplquese los medicamentos de venta libre y recetados solamente como se lo haya indicado el mdico.  No tenga relaciones sexuales hasta que el mdico lo autorice. Comunique a su compaero sexual que tiene una infeccin por hongos. Esas personas deben consultar al mdico si tienen sntomas.  No use ropa ajustada, como pantis o pantalones ajustados.  Evite el uso de tampones hasta que el mdico lo autorice.  Consuma ms yogur. Esto puede ayudar a evitar la recurrencia de la candidiasis.  Intente darse un bao de asiento para aliviar las molestias. Se trata de un bao de agua tibia que se toma mientras se est sentado. El agua solo debe llegar hasta las caderas y cubrir las nalgas. Hgalo 3o 4veces al da o como se lo haya indicado el mdico.  No se haga duchas vaginales.  Use ropa interior transpirable de algodn.  Si tiene diabetes, mantenga bajo control los niveles de   azcar en la sangre. SOLICITE ATENCIN MDICA SI:  Tiene fiebre.  Los sntomas desaparecen y luego reaparecen.  Los sntomas no mejoran con el tratamiento.  Los sntomas empeoran.  Aparecen nuevos  sntomas.  Aparecen ampollas alrededor o adentro de la vagina.  Le sale sangre de la vagina y no est menstruando.  Siente dolor en el abdomen. Esta informacin no tiene como fin reemplazar el consejo del mdico. Asegrese de hacerle al mdico cualquier pregunta que tenga. Document Released: 01/10/2005 Document Revised: 07/25/2015 Document Reviewed: 10/04/2014 Elsevier Interactive Patient Education  2018 Elsevier Inc.  

## 2017-03-27 NOTE — Progress Notes (Signed)
Lynn Aguirre 46 y.o.   Chief Complaint  Patient presents with  . Cough    and nasal congestion with headache x 1 week  . Rash    groin area with itching x 1 wwek    HISTORY OF PRESENT ILLNESS: This is a 46 y.o. female complaining of vaginal itching/infection x 1 week; seen by me 11/27 and started on antibiotics; sinus symptoms much better but now has yeast vaginal infection.  HPI   Prior to Admission medications   Medication Sig Start Date End Date Taking? Authorizing Provider  fluticasone (FLONASE) 50 MCG/ACT nasal spray Place 2 sprays into both nostrils daily. 01/28/17  Yes English, Judeth Cornfield D, PA  loratadine (CLARITIN) 10 MG tablet Take 10 mg by mouth daily as needed for allergies. Reported on 09/19/2015   Yes [provider]  simethicone (MYLANTA GAS) 125 MG chewable tablet Chew 125 mg by mouth every 6 (six) hours as needed for flatulence.   Yes [provider]  triamcinolone (NASACORT) 55 MCG/ACT AERO nasal inhaler Place 2 sprays into the nose daily. 03/12/17  Yes Vena Bassinger, Eilleen Kempf, MD  cetirizine (ZYRTEC) 10 MG tablet Take 1 tablet (10 mg total) by mouth daily. Patient not taking: Reported on 03/27/2017 01/28/17   Trena Platt D, PA  omeprazole (PRILOSEC) 40 MG capsule Take 1 capsule (40 mg total) by mouth daily. Patient not taking: Reported on 03/27/2017 12/31/16   Trena Platt D, PA  predniSONE (DELTASONE) 20 MG tablet Take 2 tablets (40 mg total) by mouth daily with breakfast. Patient not taking: Reported on 03/27/2017 01/28/17   Trena Platt D, PA    No Known Allergies  Patient Active Problem List   Diagnosis Date Noted  . Sinus congestion 03/12/2017  . Sinus pressure 03/12/2017  . Acute recurrent maxillary sinusitis 03/12/2017  . Gastroesophageal reflux disease 01/29/2017  . Seasonal allergies 01/29/2017    No past medical history on file.  Past Surgical History:  Procedure Laterality Date  . CESAREAN SECTION       Social History   Socioeconomic History  . Marital status: Married    Spouse name: Not on file  . Number of children: Not on file  . Years of education: Not on file  . Highest education level: Not on file  Social Needs  . Financial resource strain: Not on file  . Food insecurity - worry: Not on file  . Food insecurity - inability: Not on file  . Transportation needs - medical: Not on file  . Transportation needs - non-medical: Not on file  Occupational History  . Not on file  Tobacco Use  . Smoking status: Never Smoker  . Smokeless tobacco: Never Used  Substance and Sexual Activity  . Alcohol use: No  . Drug use: No  . Sexual activity: Yes    Birth control/protection: None, Condom  Other Topics Concern  . Not on file  Social History Narrative  . Not on file    Family History  Problem Relation Age of Onset  . Diabetes Father      Review of Systems  Constitutional: Negative.  Negative for chills and fever.  HENT: Negative for sore throat.   Eyes: Negative.   Respiratory: Negative.   Cardiovascular: Negative.   Gastrointestinal: Negative for abdominal pain, nausea and vomiting.  Genitourinary:       Vaginal itching   Skin: Negative for rash.  Neurological: Negative for dizziness and headaches.  All other systems reviewed and are negative.  Vitals:   03/27/17 1540  BP: 100/70  Pulse: 79  Resp: 16  Temp: 97.6 F (36.4 C)  SpO2: 95%    Physical Exam  Constitutional: She is oriented to person, place, and time. She appears well-developed and well-nourished.  HENT:  Head: Normocephalic and atraumatic.  Nose: Nose normal.  Mouth/Throat: Oropharynx is clear and moist.  Eyes: Conjunctivae and EOM are normal. Pupils are equal, round, and reactive to light.  Neck: Normal range of motion. Neck supple.  Cardiovascular: Normal rate and regular rhythm.  Pulmonary/Chest: Effort normal and breath sounds normal.  Abdominal: Soft. She exhibits no distension.  There is no tenderness.  Musculoskeletal: Normal range of motion.  Neurological: She is alert and oriented to person, place, and time.  Skin: Skin is warm and dry. Capillary refill takes less than 2 seconds.  Psychiatric: She has a normal mood and affect. Her behavior is normal.  Vitals reviewed.    ASSESSMENT & PLAN: Lynn HesselbachMaria was seen today for cough and rash.  Diagnoses and all orders for this visit:  Vaginal yeast infection -     fluconazole (DIFLUCAN) 150 MG tablet; Take 1 tablet (150 mg total) by mouth once for 1 dose.  Vaginal itching    Patient Instructions       IF you received an x-ray today, you will [facility] an [facility]. Please contact [facility] at [phone] with questions or concerns regarding your invoice.   IF you received labwork today, you will receive an [facility]. Please contact [facility] at [phone] with questions or concerns regarding your invoice.   Our billing staff will not be able to assist you with questions regarding bills from these companies.  You will be contacted with the lab results as soon as they are available. The fastest way to get your results is to activate your My Chart account. Instructions are located on the last page of this paperwork. If you have not heard from us regarding the results in 2 weeks, please contact this office.     Candidiasis vaginal en los adultos (Gastrointestinal Yeast Infection, Adult) La candidiasis vaginal es una afeccin que causa dolor, hinchazn y enrojecimiento (inflamacin) de la vagina. Tambin causa secrecin vaginal. Esta es una enfermedad frecuente. Algunas mujeres contraen esta infeccin con frecuencia. CAUSAS La causa de la infeccin es un cambio en el equilibrio normal de los hongos (cndida) y las bacterias que viven en la vagina. Esta alteracin deriva en el crecimiento excesivo de los hongos, lo que causa la inflamacin. FACTORES DE RIESGO Es ms  probable que esta afeccin se manifieste en:  Las mujeres que toman antibiticos.  Las mujeres que tienen diabetes.  Las mujeres que toman anticonceptivos.  Las mujeres que estn embarazadas.  Las mujeres que se hacen duchas vaginales con frecuencia.  Las mujeres que tienen un sistema de defensa (inmunitario) dbil.  Las mujeres que han tomado corticoides durante mucho tiempo.  Las mujeres que usan ropa ajustada con frecuencia. SNTOMAS Los sntomas de esta afeccin incluyen lo siguiente:  Secrecin vaginal blanca y espesa.  Hinchazn, picazn, enrojecimiento e irritacin de la vagina. Los labios de la vagina (vulva) tambin se pueden infectar.  Dolor o ardor al Geographical information systems officerorinar.  Dolor durante las The St. Paul Travelersrelaciones sexuales. DIAGNSTICO Esta afeccin se diagnostica mediante la historia clnica y un examen fsico. Este incluye un examen plvico. El mdico examinar una muestra de la secrecin vaginal con un microscopio. Probablemente el mdico enve esta muestra al laboratorio para Financial risk analystanalizarla y Manningconfirmar el  diagnstico. TRATAMIENTO Esta afeccin se trata con medicamentos. Los United Parcelmedicamentos pueden ser recetados o de [address] libre. Podrn indicarle que use uno o ms de lo siguiente:  Medicamentos por va oral.  Medicamentos que se aplican como una crema.  Medicamentos que se colocan directamente en la vagina (vulos vaginales). INSTRUCCIONES PARA EL CUIDADO EN EL HOGAR  Tome o aplquese los medicamentos de venta libre y Building control surveyorrecetados solamente como se lo haya indicado el mdico.  No tenga relaciones sexuales hasta que el mdico lo autorice. Comunique a su compaero sexual que tiene una infeccin por hongos. Esas personas deben consultar al mdico si tienen sntomas.  No use ropa ajustada, como pantis o pantalones ajustados.  Evite el uso de tampones hasta que el mdico lo autorice.  Consuma ms yogur. Esto puede ayudar a Artistevitar la recurrencia de la candidiasis.  Intente darse un bao de  asiento para Altria Groupaliviar las molestias. Se trata de un bao de agua tibia que se toma mientras se est sentado. El agua solo debe Adult nursellegar hasta las caderas y cubrir las nalgas. Hgalo 3o 4veces al da o como se lo haya indicado el mdico.  No se haga duchas vaginales.  Use ropa interior transpirable de algodn.  Si tiene diabetes, mantenga bajo control los niveles de Bankerazcar en la sangre. SOLICITE ATENCIN MDICA SI:  Lance Mussiene fiebre.  Los sntomas desaparecen y Stage managerluego reaparecen.  Los sntomas no mejoran con Scientist, research (medical)el tratamiento.  Los sntomas empeoran.  Aparecen nuevos sntomas.  Aparecen ampollas alrededor o adentro de la vagina.  Le sale sangre de la vagina y no est menstruando.  Siente dolor en el abdomen. Esta informacin no tiene Theme park managercomo fin reemplazar el consejo del mdico. Asegrese de hacerle al mdico cualquier pregunta que tenga. Document Released: 01/10/2005 Document Revised: 07/25/2015 Document Reviewed: 10/04/2014 Elsevier Interactive Patient Education  2018 Elsevier Inc.      Edwina BarthMiguel Hawa Henly, MD [facility]

## 2017-03-29 ENCOUNTER — Telehealth: Payer: Self-pay | Admitting: Emergency Medicine

## 2017-03-29 NOTE — Telephone Encounter (Signed)
Copied from CRM [id number]. Topic: Quick Communication - See Telephone Encounter >> Mar 29, 2017  2:54 PM Elliot GaultBell, Tiffany M wrote: CRM for notification. See Telephone encounter for:   03/29/17.   Relation to GE:XBMWpt:self Call back number: [phone] [facility] [address] [place], [place] - [address] [phone] (Phone) [phone] (Fax)    Reason for call:  Grandson requesting fluconazole, stating has 1 pill, please advise if script can be sent to pharmacy

## 2017-04-01 ENCOUNTER — Other Ambulatory Visit: Payer: Self-pay | Admitting: Emergency Medicine

## 2017-04-01 DIAGNOSIS — B373 Candidiasis of vulva and vagina: Secondary | ICD-10-CM

## 2017-04-01 DIAGNOSIS — B3731 Acute candidiasis of vulva and vagina: Secondary | ICD-10-CM

## 2017-04-01 NOTE — Telephone Encounter (Signed)
Pt seen by Dr Alvy BimlerSagardia on 03/27/17; does she need ab office visit before refill can be granted?

## 2017-07-17 ENCOUNTER — Encounter: Payer: Self-pay | Admitting: Physician Assistant

## 2018-04-23 ENCOUNTER — Ambulatory Visit: Payer: 59 | Admitting: Physician Assistant

## 2018-04-30 ENCOUNTER — Other Ambulatory Visit (HOSPITAL_COMMUNITY): Payer: Self-pay | Admitting: *Deleted

## 2018-04-30 DIAGNOSIS — Z1231 Encounter for screening mammogram for malignant neoplasm of breast: Secondary | ICD-10-CM

## 2018-07-07 ENCOUNTER — Other Ambulatory Visit (HOSPITAL_COMMUNITY): Payer: Self-pay | Admitting: *Deleted

## 2018-07-07 DIAGNOSIS — Z1231 Encounter for screening mammogram for malignant neoplasm of breast: Secondary | ICD-10-CM

## 2018-07-10 ENCOUNTER — Ambulatory Visit (HOSPITAL_COMMUNITY): Payer: 59

## 2018-10-21 ENCOUNTER — Other Ambulatory Visit: Payer: Self-pay

## 2018-10-21 ENCOUNTER — Ambulatory Visit (HOSPITAL_COMMUNITY)
Admission: RE | Admit: 2018-10-21 | Discharge: 2018-10-21 | Disposition: A | Discharge status: Home / Self Care | Payer: Self-pay | Source: Ambulatory Visit | Attending: Obstetrics and Gynecology | Admitting: Obstetrics and Gynecology

## 2018-10-21 ENCOUNTER — Ambulatory Visit
Admission: RE | Admit: 2018-10-21 | Discharge: 2018-10-21 | Disposition: A | Discharge status: Home / Self Care | Payer: No Typology Code available for payment source | Source: Ambulatory Visit | Attending: Obstetrics and Gynecology | Admitting: Obstetrics and Gynecology

## 2018-10-21 ENCOUNTER — Encounter (HOSPITAL_COMMUNITY): Payer: Self-pay

## 2018-10-21 DIAGNOSIS — Z1239 Encounter for other screening for malignant neoplasm of breast: Secondary | ICD-10-CM | POA: Insufficient documentation

## 2018-10-21 DIAGNOSIS — Z1231 Encounter for screening mammogram for malignant neoplasm of breast: Secondary | ICD-10-CM

## 2018-10-21 NOTE — Patient Instructions (Signed)
Explained breast self awareness with Timoteo Ace. Patient did not need a Pap smear today due to last Pap smear was in September 2018 per patient. Let her know [facility] will cover Pap smears every 3 years unless has a history of abnormal Pap smears. Referred patient to the [facility] for a screening mammogram. Appointment scheduled for Tuesday, October 21, 2018 at 1630. Patient aware of appointment and will be there. Let patient know [facility] will follow up with her within the next couple weeks with results of mammogram by letter or phone. Binnie Rail Chacko verbalized understanding.  Brannock, Arvil Chaco, RN 3:55 PM

## 2018-10-21 NOTE — Progress Notes (Signed)
No complaints today.   Pap Smear: Pap smear not completed today. Last Pap smear was in September 2018 at the [facility] and normal per patient. Per patient has no history of an abnormal Pap smear. No Pap smear results are in Epic.  Physical exam: Breasts Breasts symmetrical. No skin abnormalities bilateral breasts. No nipple retraction bilateral breasts. No nipple discharge bilateral breasts. No lymphadenopathy. No lumps palpated bilateral breasts. No complaints of pain or tenderness on exam. Referred patient to the [facility] for a screening mammogram. Appointment scheduled for Tuesday, October 21, 2018 at 1630.        Pelvic/Bimanual No Pap smear completed today since last Pap smear was in September 2018 per patient. Pap smear not indicated per BCCCP guidelines.   Smoking History: Patient has never smoked.  Patient Navigation: Patient education provided. Access to services provided for patient through [facility] program. Spanish interpreter provided.  Breast and Cervical Cancer Risk Assessment: Patient has no family history of breast cancer, known genetic mutations, or radiation treatment to the chest before age 51. Patient has no history of cervical dysplasia, immunocompromised, or DES exposure in-utero.  Risk Assessment    Risk Scores      10/21/2018   Last edited by: Armond Hang, LPN   5-year risk: 0.6 %   Lifetime risk: 5.8 %         Used Spanish interpreter Rudene Anda from [place].

## 2018-10-23 ENCOUNTER — Other Ambulatory Visit (HOSPITAL_COMMUNITY): Payer: Self-pay | Admitting: *Deleted

## 2018-10-23 ENCOUNTER — Encounter (HOSPITAL_COMMUNITY): Payer: Self-pay | Admitting: *Deleted

## 2018-11-03 ENCOUNTER — Ambulatory Visit: Payer: Self-pay

## 2019-06-08 ENCOUNTER — Encounter: Payer: Self-pay | Admitting: Emergency Medicine

## 2019-06-08 ENCOUNTER — Telehealth (INDEPENDENT_AMBULATORY_CARE_PROVIDER_SITE_OTHER): Payer: Self-pay | Admitting: Emergency Medicine

## 2019-06-08 ENCOUNTER — Other Ambulatory Visit: Payer: Self-pay

## 2019-06-08 VITALS — Ht 60.0 in | Wt 215.0 lb

## 2019-06-08 DIAGNOSIS — R0981 Nasal congestion: Secondary | ICD-10-CM

## 2019-06-08 DIAGNOSIS — J0100 Acute maxillary sinusitis, unspecified: Secondary | ICD-10-CM

## 2019-06-08 MED ORDER — PREDNISONE 20 MG PO TABS: 20.0000 mg | ORAL_TABLET | Freq: Every day | ORAL | 0 refills | Status: AC

## 2019-06-08 MED ORDER — SALINE SPRAY 0.65 % NA SOLN: 1.0000 | NASAL | 1 refills | Status: DC | PRN

## 2019-06-08 MED ORDER — AZITHROMYCIN 250 MG PO TABS: ORAL_TABLET | ORAL | 0 refills | Status: DC

## 2019-06-08 NOTE — Progress Notes (Signed)
Telemedicine Encounter- SOAP NOTE Established Patient  This telephone encounter was conducted with the patient's (or proxy's) verbal consent via audio telecommunications: yes/no: Yes Patient was instructed to have this encounter in a suitably private space; and to only have persons present to whom they give permission to participate. In addition, patient identity was confirmed by use of name plus two identifiers (DOB and address).  I discussed the limitations, risks, security and privacy concerns of performing an evaluation and management service by telephone and the availability of in person appointments. I also discussed with the patient that there may be a patient responsible charge related to this service. The patient expressed understanding and agreed to proceed.  I spent a total of TIME; 0 MIN TO 60 MIN: 15 minutes talking with the patient or their proxy.  Chief Complaint  Patient presents with  . Sinus Problem    sinus congestion and ear pressure for 3 weeks, ears feels heavy    Subjective   Lynn Aguirre is a 49 y.o. female established patient. Telephone visit today complaining of 3-week history of progressive symptoms including sinus pressure, stuffy ears, headaches and sinus congestion.  Slowly getting worse.  Denies fever or chills.  Denies difficulty breathing or wheezing.  Denies chest pain.  Able to eat and drink.  Denies nausea vomiting abdominal pain or diarrhea.  No other significant symptoms.  HPI   Patient Active Problem List   Diagnosis Date Noted  . Screening breast examination 10/21/2018  . Vaginal yeast infection 03/27/2017  . Sinus congestion 03/12/2017  . Sinus pressure 03/12/2017  . Acute recurrent maxillary sinusitis 03/12/2017  . Gastroesophageal reflux disease 01/29/2017  . Seasonal allergies 01/29/2017    History reviewed. No pertinent past medical history.  Current Outpatient Medications  Medication Sig Dispense Refill  . omeprazole  (PRILOSEC) 40 MG capsule Take 1 capsule (40 mg total) by mouth daily. 30 capsule 3  . cetirizine (ZYRTEC) 10 MG tablet Take 1 tablet (10 mg total) by mouth daily. (Patient not taking: Reported on 06/08/2019) 30 tablet 11  . fluticasone (FLONASE) 50 MCG/ACT nasal spray Place 2 sprays into both nostrils daily. (Patient not taking: Reported on 06/08/2019) 16 g 5  . loratadine (CLARITIN) 10 MG tablet Take 10 mg by mouth daily as needed for allergies. Reported on 09/19/2015    . predniSONE (DELTASONE) 20 MG tablet Take 2 tablets (40 mg total) by mouth daily with breakfast. (Patient not taking: Reported on 06/08/2019) 8 tablet 0  . simethicone (MYLANTA GAS) 125 MG chewable tablet Chew 125 mg by mouth every 6 (six) hours as needed for flatulence.    . triamcinolone (NASACORT) 55 MCG/ACT AERO nasal inhaler Place 2 sprays into the nose daily. (Patient not taking: Reported on 06/08/2019) 1 Inhaler 12   No current facility-administered medications for this visit.    No Known Allergies  Social History   Socioeconomic History  . Marital status: Married    Spouse name: Not on file  . Number of children: 4  . Years of education: Not on file  . Highest education level: 9th grade  Occupational History  . Not on file  Tobacco Use  . Smoking status: Never Smoker  . Smokeless tobacco: Never Used  Substance and Sexual Activity  . Alcohol use: No  . Drug use: No  . Sexual activity: Yes    Birth control/protection: None, Condom  Other Topics Concern  . Not on file  Social History Narrative  . Not on  file   Social Determinants of Health   Financial Resource Strain:   . Difficulty of Paying Living Expenses: Not on file  Food Insecurity:   . Worried About Charity fundraiser in the Last Year: Not on file  . Ran Out of Food in the Last Year: Not on file  Transportation Needs: No Transportation Needs  . Lack of Transportation (Medical): No  . Lack of Transportation (Non-Medical): No  Physical Activity:     . Days of Exercise per Week: Not on file  . Minutes of Exercise per Session: Not on file  Stress:   . Feeling of Stress : Not on file  Social Connections:   . Frequency of Communication with Friends and Family: Not on file  . Frequency of Social Gatherings with Friends and Family: Not on file  . Attends Religious Services: Not on file  . Active Member of Clubs or Organizations: Not on file  . Attends Archivist Meetings: Not on file  . Marital Status: Not on file  Intimate Partner Violence:   . Fear of Current or Ex-Partner: Not on file  . Emotionally Abused: Not on file  . Physically Abused: Not on file  . Sexually Abused: Not on file    Review of Systems  Constitutional: Negative.  Negative for chills and fever.  HENT: Positive for congestion, ear pain, sinus pain and sore throat.   Eyes: Negative.   Respiratory: Negative.  Negative for cough, shortness of breath and wheezing.   Cardiovascular: Negative.  Negative for chest pain and palpitations.  Gastrointestinal: Negative.  Negative for abdominal pain, blood in stool, diarrhea, melena, nausea and vomiting.  Genitourinary: Negative.  Negative for dysuria and hematuria.  Musculoskeletal: Negative.  Negative for back pain, myalgias and neck pain.  Skin: Negative.  Negative for rash.  Neurological: Negative for dizziness and headaches.  Endo/Heme/Allergies: Negative.   All other systems reviewed and are negative.   Objective  Alert and oriented x3 in no apparent respiratory distress. Vitals as reported by the patient: Today's Vitals   06/08/19 1658  Weight: 215 lb (97.5 kg)  Height: 5' (1.524 m)    There are no diagnoses linked to this encounter. Trinidy was seen today for sinus problem.  Diagnoses and all orders for this visit:  Acute non-recurrent maxillary sinusitis -     azithromycin (ZITHROMAX) 250 MG tablet; Sig as indicated  Sinus congestion -     predniSONE (DELTASONE) 20 MG tablet; Take 1 tablet  (20 mg total) by mouth daily with breakfast for 5 days. -     sodium chloride (OCEAN) 0.65 % SOLN nasal spray; Place 1 spray into both nostrils as needed for congestion.     I discussed the assessment and treatment plan with the patient. The patient was provided an opportunity to ask questions and all were answered. The patient agreed with the plan and demonstrated an understanding of the instructions.   The patient was advised to call back or seek an in-person evaluation if the symptoms worsen or if the condition fails to improve as anticipated.  I provided 15 minutes of non-face-to-face time during this encounter.  Horald Pollen, MD  Primary Care at [facility]

## 2019-06-15 ENCOUNTER — Encounter: Payer: Self-pay | Admitting: Emergency Medicine

## 2019-06-15 ENCOUNTER — Telehealth (INDEPENDENT_AMBULATORY_CARE_PROVIDER_SITE_OTHER): Payer: Self-pay | Admitting: Emergency Medicine

## 2019-06-15 DIAGNOSIS — H6693 Otitis media, unspecified, bilateral: Secondary | ICD-10-CM

## 2019-06-15 DIAGNOSIS — H9203 Otalgia, bilateral: Secondary | ICD-10-CM

## 2019-06-15 MED ORDER — AMOXICILLIN-POT CLAVULANATE 875-125 MG PO TABS: 1.0000 | ORAL_TABLET | Freq: Two times a day (BID) | ORAL | 0 refills | Status: AC

## 2019-06-15 MED ORDER — HYDROCORTISONE-ACETIC ACID 1-2 % OT SOLN: 3.0000 [drp] | Freq: Three times a day (TID) | OTIC | 1 refills | Status: DC

## 2019-06-15 NOTE — Progress Notes (Signed)
Telemedicine Encounter- SOAP NOTE Established Patient MyChart video conference attempted without success. This telephone encounter was conducted with the patient's (or proxy's) verbal consent via audio telecommunications: yes/no: Yes Patient was instructed to have this encounter in a suitably private space; and to only have persons present to whom they give permission to participate. In addition, patient identity was confirmed by use of name plus two identifiers (DOB and address).  I discussed the limitations, risks, security and privacy concerns of performing an evaluation and management service by telephone and the availability of in person appointments. I also discussed with the patient that there may be a patient responsible charge related to this service. The patient expressed understanding and agreed to proceed.  I spent a total of TIME; 0 MIN TO 60 MIN: 15 minutes talking with the patient or their proxy.  Chief Complaint  Patient presents with  . Follow-up    ear discomfort/ pain , eye pain , nasal pain     Subjective   Lynn Aguirre is a 49 y.o. female established patient. Telephone visit today for follow-up of 06/08/2019 visit.  Feels about 50% better.  Treated for sinusitis with Zithromax and prednisone.  However still complaining of bilateral ear pain.  No other significant symptoms.  HPI   Patient Active Problem List   Diagnosis Date Noted  . Screening breast examination 10/21/2018  . Vaginal yeast infection 03/27/2017  . Sinus congestion 03/12/2017  . Sinus pressure 03/12/2017  . Acute recurrent maxillary sinusitis 03/12/2017  . Gastroesophageal reflux disease 01/29/2017  . Seasonal allergies 01/29/2017    History reviewed. No pertinent past medical history.  Current Outpatient Medications  Medication Sig Dispense Refill  . azithromycin (ZITHROMAX) 250 MG tablet Sig as indicated 6 tablet 0  . cetirizine (ZYRTEC) 10 MG tablet Take 1 tablet (10 mg total) by  mouth daily. 30 tablet 11  . fluticasone (FLONASE) 50 MCG/ACT nasal spray Place 2 sprays into both nostrils daily. 16 g 5  . loratadine (CLARITIN) 10 MG tablet Take 10 mg by mouth daily as needed for allergies. Reported on 09/19/2015    . omeprazole (PRILOSEC) 40 MG capsule Take 1 capsule (40 mg total) by mouth daily. 30 capsule 3  . simethicone (MYLANTA GAS) 125 MG chewable tablet Chew 125 mg by mouth every 6 (six) hours as needed for flatulence.    . sodium chloride (OCEAN) 0.65 % SOLN nasal spray Place 1 spray into both nostrils as needed for congestion. 30 mL 1  . triamcinolone (NASACORT) 55 MCG/ACT AERO nasal inhaler Place 2 sprays into the nose daily. 1 Inhaler 12  . acetic acid-hydrocortisone (VOSOL-HC) OTIC solution Place 3 drops into the left ear 3 (three) times daily. 10 mL 1  . amoxicillin-clavulanate (AUGMENTIN) 875-125 MG tablet Take 1 tablet by mouth 2 (two) times daily for 7 days. 14 tablet 0   No current facility-administered medications for this visit.    No Known Allergies  Social History   Socioeconomic History  . Marital status: Married    Spouse name: Not on file  . Number of children: 4  . Years of education: Not on file  . Highest education level: 9th grade  Occupational History  . Not on file  Tobacco Use  . Smoking status: Never Smoker  . Smokeless tobacco: Never Used  Substance and Sexual Activity  . Alcohol use: No  . Drug use: No  . Sexual activity: Yes    Birth control/protection: None, Condom  Other Topics  Concern  . Not on file  Social History Narrative  . Not on file   Social Determinants of Health   Financial Resource Strain:   . Difficulty of Paying Living Expenses: Not on file  Food Insecurity:   . Worried About Programme researcher, broadcasting/film/video in the Last Year: Not on file  . Ran Out of Food in the Last Year: Not on file  Transportation Needs: No Transportation Needs  . Lack of Transportation (Medical): No  . Lack of Transportation (Non-Medical): No   Physical Activity:   . Days of Exercise per Week: Not on file  . Minutes of Exercise per Session: Not on file  Stress:   . Feeling of Stress : Not on file  Social Connections:   . Frequency of Communication with Friends and Family: Not on file  . Frequency of Social Gatherings with Friends and Family: Not on file  . Attends Religious Services: Not on file  . Active Member of Clubs or Organizations: Not on file  . Attends Banker Meetings: Not on file  . Marital Status: Not on file  Intimate Partner Violence:   . Fear of Current or Ex-Partner: Not on file  . Emotionally Abused: Not on file  . Physically Abused: Not on file  . Sexually Abused: Not on file    Review of Systems  Constitutional: Negative.  Negative for chills and fever.  HENT: Positive for congestion and ear pain.   Respiratory: Negative.  Negative for cough and shortness of breath.   Cardiovascular: Negative.  Negative for chest pain and palpitations.  Gastrointestinal: Negative for abdominal pain, blood in stool, diarrhea, nausea and vomiting.  Genitourinary: Negative.   Skin: Negative.   Neurological: Negative.  Negative for dizziness and headaches.  Endo/Heme/Allergies: Negative.   All other systems reviewed and are negative.   Objective  Alert and oriented x3 in no apparent respiratory distress. Vitals as reported by the patient: There were no vitals filed for this visit.  Lynn Aguirre was seen today for follow-up.  Diagnoses and all orders for this visit:  Acute infection of both ears -     amoxicillin-clavulanate (AUGMENTIN) 875-125 MG tablet; Take 1 tablet by mouth 2 (two) times daily for 7 days. -     acetic acid-hydrocortisone (VOSOL-HC) OTIC solution; Place 3 drops into the left ear 3 (three) times daily.  Otalgia of both ears     I discussed the assessment and treatment plan with the patient. The patient was provided an opportunity to ask questions and all were answered. The patient  agreed with the plan and demonstrated an understanding of the instructions.   The patient was advised to call back or seek an in-person evaluation if the symptoms worsen or if the condition fails to improve as anticipated.  I provided 15 minutes of non-face-to-face time during this encounter.  Georgina Quint, MD  Primary Care at [facility]

## 2019-07-08 ENCOUNTER — Encounter: Payer: Self-pay | Admitting: Emergency Medicine

## 2019-07-08 ENCOUNTER — Other Ambulatory Visit: Payer: Self-pay

## 2019-07-08 ENCOUNTER — Ambulatory Visit (INDEPENDENT_AMBULATORY_CARE_PROVIDER_SITE_OTHER): Payer: Self-pay | Admitting: Emergency Medicine

## 2019-07-08 VITALS — BP 111/78 | HR 95 | Temp 98.1°F | Resp 15 | Ht 62.0 in | Wt 214.8 lb

## 2019-07-08 DIAGNOSIS — Z7689 Persons encountering health services in other specified circumstances: Secondary | ICD-10-CM

## 2019-07-08 DIAGNOSIS — J324 Chronic pansinusitis: Secondary | ICD-10-CM

## 2019-07-08 DIAGNOSIS — J329 Chronic sinusitis, unspecified: Secondary | ICD-10-CM

## 2019-07-08 MED ORDER — METHYLPREDNISOLONE 4 MG PO TBPK: ORAL_TABLET | ORAL | 1 refills | Status: DC

## 2019-07-08 MED ORDER — TRIAMCINOLONE ACETONIDE 55 MCG/ACT NA AERO: 2.0000 | INHALATION_SPRAY | Freq: Every day | NASAL | 12 refills | Status: DC

## 2019-07-08 MED ORDER — PSEUDOEPHEDRINE-GUAIFENESIN ER 60-600 MG PO TB12: 1.0000 | ORAL_TABLET | Freq: Two times a day (BID) | ORAL | 1 refills | Status: AC

## 2019-07-08 NOTE — Progress Notes (Signed)
Lynn Aguirre 49 y.o.   Chief Complaint  Patient presents with  . Establish Care    pt would like her ears to be checked denies pain has a full feeling, some sinus pain without congestion, pt would also like referral to see GYN for Pap    HISTORY OF PRESENT ILLNESS: This is a 49 y.o. female complaining of sinus complaints on and off for years.  Has never seen an ENT doctor about this. Mostly complaining of intermittent sinus pressure and congestion without nose bleeding or discharge.  Has some ear fullness but denies fever, chills, cough, sore throat, nausea, vomiting, dizziness, or any other significant symptoms.  HPI   Prior to Admission medications   Medication Sig Start Date End Date Taking? Authorizing Provider  omeprazole (PRILOSEC) 40 MG capsule Take 1 capsule (40 mg total) by mouth daily. 12/31/16  Yes English, Judeth Cornfield D, PA  acetic acid-hydrocortisone (VOSOL-HC) OTIC solution Place 3 drops into the left ear 3 (three) times daily. Patient not taking: Reported on 07/08/2019 06/15/19   Georgina Quint, MD  azithromycin [facility]) 250 MG tablet Sig as indicated Patient not taking: Reported on 07/08/2019 06/08/19   Georgina Quint, MD  cetirizine (ZYRTEC) 10 MG tablet Take 1 tablet (10 mg total) by mouth daily. Patient not taking: Reported on 07/08/2019 01/28/17   Trena Platt D, PA  fluticasone [facility]) 50 MCG/ACT nasal spray Place 2 sprays into both nostrils daily. Patient not taking: Reported on 07/08/2019 01/28/17   Trena Platt D, PA  loratadine (CLARITIN) 10 MG tablet Take 10 mg by mouth daily as needed for allergies. Reported on 09/19/2015    [provider]  simethicone (MYLANTA GAS) 125 MG chewable tablet Chew 125 mg by mouth every 6 (six) hours as needed for flatulence.    [provider]  sodium chloride (OCEAN) 0.65 % SOLN nasal spray Place 1 spray into both nostrils as needed for congestion. Patient not taking: Reported on  07/08/2019 06/08/19   Georgina Quint, MD  triamcinolone (NASACORT) 55 MCG/ACT AERO nasal inhaler Place 2 sprays into the nose daily. Patient not taking: Reported on 07/08/2019 03/12/17   Georgina Quint, MD    No Known Allergies  Patient Active Problem List   Diagnosis Date Noted  . Gastroesophageal reflux disease 01/29/2017  . Seasonal allergies 01/29/2017    Past Medical History:  Diagnosis Date  . Allergy     Past Surgical History:  Procedure Laterality Date  . CESAREAN SECTION      Social History   Socioeconomic History  . Marital status: Married    Spouse name: Not on file  . Number of children: 4  . Years of education: Not on file  . Highest education level: 9th grade  Occupational History  . Not on file  Tobacco Use  . Smoking status: Never Smoker  . Smokeless tobacco: Never Used  Substance and Sexual Activity  . Alcohol use: No  . Drug use: No  . Sexual activity: Yes    Birth control/protection: None, Condom  Other Topics Concern  . Not on file  Social History Narrative  . Not on file   Social Determinants of Health   Financial Resource Strain:   . Difficulty of Paying Living Expenses:   Food Insecurity:   . Worried About Programme researcher, broadcasting/film/video in the Last Year:   . Barista in the Last Year:   Transportation Needs: No Transportation Needs  . Lack of Transportation (  Medical): No  . Lack of Transportation (Non-Medical): No  Physical Activity:   . Days of Exercise per Week:   . Minutes of Exercise per Session:   Stress:   . Feeling of Stress :   Social Connections:   . Frequency of Communication with Friends and Family:   . Frequency of Social Gatherings with Friends and Family:   . Attends Religious Services:   . Active Member of Clubs or Organizations:   . Attends Archivist Meetings:   Marland Kitchen Marital Status:   Intimate Partner Violence:   . Fear of Current or Ex-Partner:   . Emotionally Abused:   Marland Kitchen Physically Abused:     . Sexually Abused:     Family History  Problem Relation Age of Onset  . Diabetes Father      Review of Systems  Constitutional: Negative.  Negative for chills and fever.  HENT: Positive for congestion and sinus pain. Negative for ear discharge, ear pain, hearing loss, nosebleeds, sore throat and tinnitus.   Eyes: Negative for blurred vision and double vision.  Respiratory: Negative.  Negative for cough and shortness of breath.   Cardiovascular: Negative.  Negative for chest pain and palpitations.  Gastrointestinal: Negative.  Negative for abdominal pain, blood in stool, diarrhea, melena, nausea and vomiting.  Genitourinary: Negative.  Negative for dysuria and hematuria.  Musculoskeletal: Negative.  Negative for myalgias and neck pain.  Skin: Negative.  Negative for rash.  Neurological: Negative.  Negative for dizziness and headaches.  All other systems reviewed and are negative.   Today's Vitals   07/08/19 1050  BP: 111/78  Pulse: 95  Resp: 15  Temp: 98.1 F (36.7 C)  TempSrc: Temporal  SpO2: 95%  Weight: 214 lb 12.8 oz (97.4 kg)  Height: 5\' 2"  (1.575 m)   Body mass index is 39.29 kg/m.  Physical Exam Vitals reviewed.  Constitutional:      Appearance: Normal appearance.  HENT:     Head: Normocephalic.     Right Ear: Tympanic membrane, ear canal and external ear normal.     Left Ear: Tympanic membrane, ear canal and external ear normal.     Nose:     Right Sinus: Maxillary sinus tenderness present.     Left Sinus: Maxillary sinus tenderness present.     Mouth/Throat:     Mouth: Mucous membranes are moist.     Pharynx: Oropharynx is clear.  Eyes:     Extraocular Movements: Extraocular movements intact.     Conjunctiva/sclera: Conjunctivae normal.     Pupils: Pupils are equal, round, and reactive to light.  Neurological:     Mental Status: She is alert.      ASSESSMENT & PLAN: Lynn Aguirre was seen today for establish care.  Diagnoses and all orders for this  visit:  Chronic pansinusitis -     methylPREDNISolone (MEDROL DOSEPAK) 4 MG TBPK tablet; Sig as indicated -     Ambulatory referral to ENT  Chronic congestion of paranasal sinus -     pseudoephedrine-guaifenesin (MUCINEX D) 60-600 MG 12 hr tablet; Take 1 tablet by mouth every 12 (twelve) hours for 5 days. -     triamcinolone (NASACORT) 55 MCG/ACT AERO nasal inhaler; Place 2 sprays into the nose daily. -     Ambulatory referral to ENT  Encounter to establish care    Patient Instructions       If you have lab work done today you will be contacted with your lab results within the  next 2 weeks.  If you have not heard from [place] then please contact us. The fastest way to get your results is to register for My Chart.   IF you received an x-ray today, you will receive an invoice from [facility]. Please contact [facility] at [phone] with questions or concerns regarding your invoice.   IF you received labwork today, you will receive an invoice from [place]. Please contact [facility] at [phone] with questions or concerns regarding your invoice.   Our billing staff will not be able to assist you with questions regarding bills from these companies.  You will be contacted with the lab results as soon as they are available. The fastest way to get your results is to activate your My Chart account. Instructions are located on the last page of this paperwork. If you have not heard from [place] regarding the results in 2 weeks, please contact this office.     Sinusitis, en adultos Sinusitis, Adult La sinusitis es el dolor y la hinchazn (inflamacin) de los senos paranasales. Los senos paranasales son espacios vacos en los huesos alrededor del rostro. Estos se encuentran en los siguientes lugares:  Alrededor de los ojos.  En la mitad de la frente.  Detrs de Architectural technologist.  En los pmulos. Los senos paranasales y las fosas nasales estn cubiertos de un lquido llamado  mucosidad. La mucosidad drena a travs de los senos paranasales. La hinchazn puede atrapar mucosidad en los senos paranasales. Esto permite que se desarrollen grmenes (bacterias, virus u hongos), lo que produce infecciones. La [employer], la causa de esta afeccin es un virus. Cules son las causas? Las causas de esta afeccin son:  [place].  Asma.  Grmenes.  Objetos que obstruyen la nariz o los senos paranasales.  Crecimientos en el interior de la nariz (plipos nasales).  Sustancias qumicas o irritantes que estn presentes en el aire.  Hongos (poco frecuente). Qu incrementa el riesgo? Es ms probable que contraiga esta afeccin si:  Tiene debilitado el sistema de defensa del organismo (sistema inmunitario).  Nada o bucea mucho.  [place] aerosoles nasales en exceso.  Fuma. Cules son los signos o los sntomas? Los principales sntomas de esta afeccin son dolor y sensacin de presin alrededor de los senos paranasales. Otros sntomas pueden incluir los siguientes:  Nariz tapada (congestin nasal).  Goteo nasal (drenaje).  Hinchazn y calor en los senos paranasales.  Dolor de [place].  Dolor dental.  Tos que puede empeorar por la noche.  Mucosidad que se acumula en la garganta o la parte posterior de la nariz (goteo posnasal).  Incapacidad de sentir olores y sabores.  Estar muy cansado (fatiga).  Grant Ruts.  Dolor de Advertising copywriter.  Mal aliento. Cmo se diagnostica? Esta afeccin se diagnostica en funcin de lo siguiente:  Sus sntomas.  Sus antecedentes mdicos.  Un examen fsico.  Pruebas para averiguar si la afeccin es de corta duracin [place]) o de larga duracin (crnica). El mdico puede: ? Revisarle la nariz para detectar crecimientos (plipos). ? Revisarle los senos paranasales con una herramienta que tiene una luz (endoscopio). ? Revisar si tiene alergias o grmenes. ? Hacerle pruebas de diagnstico por imgenes, como una resonancia  magntica (RM) o exploracin por tomografa computarizada (TC). Cmo se trata? El tratamiento de esta afeccin depende de la causa y si es de corta o de larga duracin.  Si la causa es un virus, los sntomas deberan desaparecer solos en el trmino de 10das. Pueden darle medicamentos para Paramedic  los sntomas. Entre ellos, se incluyen los siguientes: ? Medicamentos para Actuary tejido inflamado de la Clinical cytogeneticist. ? Medicamentos para tratar alergias (antihistamnicos). ? Un aerosol para tratar la hinchazn de las fosas nasales. ? Enjuagues que ayudan a eliminar la mucosidad espesa de la nariz (lavados con solucin salina nasal).  Si la causa es una bacteria, es posible que el mdico espere para averiguar si usted mejora sin TEFL teacher. Es posible que le den un antibitico si usted tiene: ? Una infeccin grave. ? El sistema de defensa del organismo debilitado.  Si la causa son crecimientos en la Darene Lamer, es posible que necesite [place]. Siga estas indicaciones en su casa: [employer], use o aplique los medicamentos de venta libre y los recetados solamente como se lo haya indicado el mdico. Estos pueden incluir aerosoles nasales.  Si le recetaron un antibitico, tmelo como se lo haya indicado el mdico. No deje de tomar los antibiticos aunque comience a Actor. Hidrtese y humidifique los ambientes   Beba suficiente agua para Pharmacologist el pis (la orina) de color amarillo plido.  Use un humidificador de vapor fro para mantener la humedad de su hogar por encima del 50%.  Inhale vapor durante 10a , de 3a 4veces al da, o como se lo haya indicado el mdico. Puede hacer esto en el bao con el vapor del agua caliente de la ducha.  Trate de no exponerse al aire fro o seco. Reposo  Descanse todo lo posible.  Duerma con la cabeza levantada (elevada).  Asegrese de dormir lo suficiente cada noche. Indicaciones generales   Pngase un pao caliente y  hmedo en el rostro 3 a 4 veces al da, o con la frecuencia indicada por el mdico. Esto ayuda a Multimedia programmer.  Lvese las manos frecuentemente con agua y [place]. Use un desinfectante para manos si no dispone de [place] y [place].  No fume. Evite estar cerca de personas que fuman (fumador pasivo).  Concurra a todas las visitas de [address] se lo haya indicado el mdico. Esto es importante. Comunquese con un mdico si:  Tiene fiebre.  Sus sntomas empeoran.  Los sntomas no mejoran en el perodo de 10das. Solicite ayuda inmediatamente si:  Tiene un dolor de cabeza muy intenso.  No puede dejar de vomitar.  Tiene dolor muy intenso o hinchazn en la zona del rostro o los ojos.  Tiene dificultad para ver.  Se siente confundido.  Tiene el cuello rgido.  Tiene dificultad para respirar. Resumen  La sinusitis es la hinchazn de los senos paranasales. Los senos paranasales son espacios vacos en los huesos alrededor del rostro.  La causa de esta afeccin es la inflamacin o hinchazn de los tejidos que estn en el interior de la [place]. Esto hace que los grmenes queden atrapados. Los grmenes pueden provocar infecciones.  Si le recetaron un antibitico, tmelo como se lo haya indicado el mdico. No deje de tomarlo aunque comience a sentirse mejor.  Concurra a todas las visitas de [address] se lo haya indicado el mdico. Esto es importante. Esta informacin no tiene Theme park manager el consejo del mdico. Asegrese de hacerle al mdico cualquier pregunta que tenga. Document Revised: 10/16/2017 Document Reviewed: 10/16/2017 Elsevier Patient Education  2020 Elsevier Inc.      Edwina Barth, MD [facility]

## 2019-07-08 NOTE — Patient Instructions (Addendum)
   If you have lab work done today you will be contacted with your lab results within the next 2 weeks.  If you have not heard from us then please contact us. The fastest way to get your results is to register for My Chart.   IF you received an x-ray today, you will receive an invoice from [facility]. Please contact [facility] at [phone] with questions or concerns regarding your invoice.   IF you received labwork today, you will receive an invoice from [facility]. Please contact [facility] at [phone] with questions or concerns regarding your invoice.   Our billing staff will not be able to assist you with questions regarding bills from these companies.  You will be contacted with the lab results as soon as they are available. The fastest way to get your results is to activate your My Chart account. Instructions are located on the last page of this paperwork. If you have not heard from us regarding the results in 2 weeks, please contact this office.     Sinusitis, en adultos Sinusitis, Adult La sinusitis es el dolor y la hinchazn (inflamacin) de los senos paranasales. Los senos paranasales son espacios vacos en los huesos alrededor del rostro. Estos se encuentran en los siguientes lugares:  Alrededor de los ojos.  En la mitad de la frente.  Detrs de la nariz.  En los pmulos. Los senos paranasales y las fosas nasales estn cubiertos de un lquido llamado mucosidad. La mucosidad drena a travs de los senos paranasales. La hinchazn puede atrapar mucosidad en los senos paranasales. Esto permite que se desarrollen grmenes (bacterias, virus u hongos), lo que produce infecciones. La mayora de las veces, la causa de esta afeccin es un virus. Cules son las causas? Las causas de esta afeccin son:  Alergias.  Asma.  Grmenes.  Objetos que obstruyen la nariz o los senos paranasales.  Crecimientos en el interior de la nariz (plipos  nasales).  Sustancias qumicas o irritantes que estn presentes en el aire.  Hongos (poco frecuente). Qu incrementa el riesgo? Es ms probable que contraiga esta afeccin si:  Tiene debilitado el sistema de defensa del organismo (sistema inmunitario).  Nada o bucea mucho.  [place] aerosoles nasales en exceso.  Fuma. Cules son los signos o los sntomas? Los principales sntomas de esta afeccin son dolor y sensacin de presin alrededor de los senos paranasales. Otros sntomas pueden incluir los siguientes:  Nariz tapada (congestin nasal).  Goteo nasal (drenaje).  Hinchazn y calor en los senos paranasales.  Dolor de cabeza.  Dolor dental.  Tos que puede empeorar por la noche.  Mucosidad que se acumula en la garganta o la parte posterior de la nariz (goteo posnasal).  Incapacidad de sentir olores y sabores.  Estar muy cansado (fatiga).  Fiebre.  Dolor de garganta.  Mal aliento. Cmo se diagnostica? Esta afeccin se diagnostica en funcin de lo siguiente:  Sus sntomas.  Sus antecedentes mdicos.  Un examen fsico.  Pruebas para averiguar si la afeccin es de corta duracin (aguda) o de larga duracin (crnica). El mdico puede: ? Revisarle la nariz para detectar crecimientos (plipos). ? Revisarle los senos paranasales con una herramienta que tiene una luz (endoscopio). ? Revisar si tiene alergias o grmenes. ? Hacerle pruebas de diagnstico por imgenes, como una resonancia magntica (RM) o exploracin por tomografa computarizada (TC). Cmo se trata? El tratamiento de esta afeccin depende de la causa y si es de corta o de larga duracin.  Si la   causa es un virus, los sntomas deberan desaparecer solos en el trmino de 10das. Pueden darle medicamentos para aliviar los sntomas. Entre ellos, se incluyen los siguientes: ? Medicamentos para encoger el tejido inflamado de la nariz. ? Medicamentos para tratar alergias (antihistamnicos). ? Un aerosol  para tratar la hinchazn de las fosas nasales. ? Enjuagues que ayudan a eliminar la mucosidad espesa de la nariz (lavados con solucin salina nasal).  Si la causa es una bacteria, es posible que el mdico espere para averiguar si usted mejora sin tratamiento. Es posible que le den un antibitico si usted tiene: ? Una infeccin grave. ? El sistema de defensa del organismo debilitado.  Si la causa son crecimientos en la nariz, es posible que necesite una ciruga. Siga estas indicaciones en su casa: Medicamentos  [place], use o aplique los medicamentos de venta libre y los recetados solamente como se lo haya indicado el mdico. Estos pueden incluir aerosoles nasales.  Si le recetaron un antibitico, tmelo como se lo haya indicado el mdico. No deje de tomar los antibiticos aunque comience a sentirse mejor. Hidrtese y humidifique los ambientes   Beba suficiente agua para mantener el pis (la orina) de color amarillo plido.  Use un humidificador de vapor fro para mantener la humedad de su hogar por encima del 50%.  Inhale vapor durante 10a 15minutos, de 3a 4veces al da, o como se lo haya indicado el mdico. Puede hacer esto en el bao con el vapor del agua caliente de la ducha.  Trate de no exponerse al aire fro o seco. Reposo  Descanse todo lo posible.  Duerma con la cabeza levantada (elevada).  Asegrese de dormir lo suficiente cada noche. Indicaciones generales   Pngase un pao caliente y hmedo en el rostro 3 a 4 veces al da, o con la frecuencia indicada por el mdico. Esto ayuda a calmar las molestias.  Lvese las manos frecuentemente con agua y jabn. Use un desinfectante para manos si no dispone de agua y jabn.  No fume. Evite estar cerca de personas que fuman (fumador pasivo).  Concurra a todas las visitas de seguimiento como se lo haya indicado el mdico. Esto es importante. Comunquese con un mdico si:  Tiene fiebre.  Sus sntomas empeoran.  Los  sntomas no mejoran en el perodo de 10das. Solicite ayuda inmediatamente si:  Tiene un dolor de cabeza muy intenso.  No puede dejar de vomitar.  Tiene dolor muy intenso o hinchazn en la zona del rostro o los ojos.  Tiene dificultad para ver.  Se siente confundido.  Tiene el cuello rgido.  Tiene dificultad para respirar. Resumen  La sinusitis es la hinchazn de los senos paranasales. Los senos paranasales son espacios vacos en los huesos alrededor del rostro.  La causa de esta afeccin es la inflamacin o hinchazn de los tejidos que estn en el interior de la nariz. Esto hace que los grmenes queden atrapados. Los grmenes pueden provocar infecciones.  Si le recetaron un antibitico, tmelo como se lo haya indicado el mdico. No deje de tomarlo aunque comience a sentirse mejor.  Concurra a todas las visitas de seguimiento como se lo haya indicado el mdico. Esto es importante. Esta informacin no tiene como fin reemplazar el consejo del mdico. Asegrese de hacerle al mdico cualquier pregunta que tenga. Document Revised: 10/16/2017 Document Reviewed: 10/16/2017 Elsevier Patient Education  2020 Elsevier Inc.  

## 2019-07-27 ENCOUNTER — Ambulatory Visit (INDEPENDENT_AMBULATORY_CARE_PROVIDER_SITE_OTHER): Payer: PRIVATE HEALTH INSURANCE | Admitting: Otolaryngology

## 2019-07-27 ENCOUNTER — Encounter (INDEPENDENT_AMBULATORY_CARE_PROVIDER_SITE_OTHER): Payer: Self-pay | Admitting: Otolaryngology

## 2019-07-27 ENCOUNTER — Other Ambulatory Visit: Payer: Self-pay

## 2019-07-27 ENCOUNTER — Encounter: Payer: Self-pay | Admitting: Emergency Medicine

## 2019-07-27 VITALS — Temp 97.7°F

## 2019-07-27 DIAGNOSIS — J310 Chronic rhinitis: Secondary | ICD-10-CM | POA: Diagnosis not present

## 2019-07-27 NOTE — Progress Notes (Addendum)
HPI: Lynn Aguirre is a 49 y.o. female who presents is referred by Dr. Mitchel Honour for evaluation of chronic nasal sinus complaints.  She presents today with a Optometrist.  She complains of chronic sinus pressure as well as pain in the roof of her mouth and in both ears that has been going on for the past 4 years.  It has been worse the last 2 months.  She has not noted any problem with her hearing.  She has been treated with steroids as well as Mucinex D and antibiotic eardrops. She describes pain in the nose when she breathes as well as pain and discomfort behind her eyes. She has not noted any significant trouble breathing. Denies any yellow-green discharge from her nose.Marland Kitchen  Past Medical History:  Diagnosis Date  . Allergy    Past Surgical History:  Procedure Laterality Date  . CESAREAN SECTION     Social History   Socioeconomic History  . Marital status: Married    Spouse name: Not on file  . Number of children: 4  . Years of education: Not on file  . Highest education level: 9th grade  Occupational History  . Not on file  Tobacco Use  . Smoking status: Never Smoker  . Smokeless tobacco: Never Used  Substance and Sexual Activity  . Alcohol use: No  . Drug use: No  . Sexual activity: Yes    Birth control/protection: None, Condom  Other Topics Concern  . Not on file  Social History Narrative  . Not on file   Social Determinants of Health   Financial Resource Strain:   . Difficulty of Paying Living Expenses:   Food Insecurity:   . Worried About Charity fundraiser in the Last Year:   . Arboriculturist in the Last Year:   Transportation Needs: No Transportation Needs  . Lack of Transportation (Medical): No  . Lack of Transportation (Non-Medical): No  Physical Activity:   . Days of Exercise per Week:   . Minutes of Exercise per Session:   Stress:   . Feeling of Stress :   Social Connections:   . Frequency of Communication with Friends and Family:   . Frequency of  Social Gatherings with Friends and Family:   . Attends Religious Services:   . Active Member of Clubs or Organizations:   . Attends Archivist Meetings:   Marland Kitchen Marital Status:    Family History  Problem Relation Age of Onset  . Diabetes Father    No Known Allergies Prior to Admission medications   Medication Sig Start Date End Date Taking? Authorizing Provider  acetic acid-hydrocortisone (VOSOL-HC) OTIC solution Place 3 drops into the left ear 3 (three) times daily. 06/15/19  Yes Horald Pollen, MD  azithromycin [facility]) 250 MG tablet Sig as indicated 06/08/19  Yes Sagardia, Ines Bloomer, MD  cetirizine (ZYRTEC) 10 MG tablet Take 1 tablet (10 mg total) by mouth daily. 01/28/17  Yes English, Stephanie D, PA  fluticasone (FLONASE) 50 MCG/ACT nasal spray Place 2 sprays into both nostrils daily. 01/28/17  Yes English, Colletta Maryland D, PA  loratadine (CLARITIN) 10 MG tablet Take 10 mg by mouth daily as needed for allergies. Reported on 09/19/2015   Yes [provider]  methylPREDNISolone (MEDROL DOSEPAK) 4 MG TBPK tablet Sig as indicated 07/08/19  Yes Sagardia, Ines Bloomer, MD  omeprazole (PRILOSEC) 40 MG capsule Take 1 capsule (40 mg total) by mouth daily. 12/31/16  Yes Joretta Bachelor, PA  simethicone (MYLANTA GAS) 125 MG chewable tablet Chew 125 mg by mouth every 6 (six) hours as needed for flatulence.   Yes [provider]  sodium chloride (OCEAN) 0.65 % SOLN nasal spray Place 1 spray into both nostrils as needed for congestion. 06/08/19  Yes Sagardia, Eilleen Kempf, MD  triamcinolone (NASACORT) 55 MCG/ACT AERO nasal inhaler Place 2 sprays into the nose daily. 07/08/19  Yes Sagardia, Eilleen Kempf, MD     Positive ROS: Otherwise negative  All other systems have been reviewed and were otherwise negative with the exception of those mentioned in the HPI and as above.  Physical Exam: Constitutional: Alert, well-appearing, no acute distress Ears: External ears without  lesions or tenderness. Ear canals are clear bilaterally with intact, clear TMs bilaterally.  Hearing screen with a 512 1024 tuning fork revealed symmetric hearing with good hearing in both ears and AC > BC bilaterally. Nasal: External nose without lesions. Septum with septal spur posteriorly on the left side.  Nasal endoscopy was performed in the office today and on nasal endoscopy both middle meatus regions were clear.  Maxillary ostia appeared patent and clear bilaterally.  Posterior ethmoid area was clear with no active drainage noted.  Sphenoid region was clear with no active purulent discharge noted. Oral: Lips and gums without lesions. Tongue and palate mucosa without lesions. Posterior oropharynx clear. Neck: No palpable adenopathy or masses Respiratory: Breathing comfortably  Skin: No facial/neck lesions or rash noted.  Nasal/sinus endoscopy  Date/Time: 07/27/2019 4:53 PM Performed by: Drema Halon, MD Authorized by: Drema Halon, MD   Consent:    Consent obtained:  Verbal   Consent given by:  Patient Procedure details:    Indications: sino-nasal symptoms     Medication:  Afrin   Instrument: flexible fiberoptic nasal endoscope     Scope location: bilateral nare   Septum:    Spurs: left   Sinus:    Right middle meatus: normal     Left middle meatus: normal     Right nasopharynx: normal     Left nasopharynx: normal   Comments:     On nasal endoscopy middle meatus region and sinus region was clear with no obvious mucopurulent discharge noted.    Assessment: Chronic rhinitis Questionable sinus disease.  Plan: Recommended use of Nasacort 2 sprays each nostril at night. She has saline rinse that she can use on a as needed basis. We will plan on scheduling a CT scan of the sinuses to better evaluate the sinuses and have her follow-up following the CT scan to review this.   Narda Bonds, MD

## 2019-07-27 NOTE — Telephone Encounter (Signed)
FYI: Pt saw Dr. Ezzard Standing Nasal endoscopy was done and is normal Next step CT for sinuses

## 2019-07-28 ENCOUNTER — Other Ambulatory Visit (INDEPENDENT_AMBULATORY_CARE_PROVIDER_SITE_OTHER): Payer: Self-pay

## 2019-07-28 DIAGNOSIS — J329 Chronic sinusitis, unspecified: Secondary | ICD-10-CM

## 2019-07-28 NOTE — Progress Notes (Signed)
Ct

## 2019-08-05 ENCOUNTER — Other Ambulatory Visit: Payer: Self-pay | Admitting: Otolaryngology

## 2019-08-07 ENCOUNTER — Ambulatory Visit
Admission: RE | Admit: 2019-08-07 | Discharge: 2019-08-07 | Disposition: A | Discharge status: Home / Self Care | Payer: PRIVATE HEALTH INSURANCE | Source: Ambulatory Visit | Attending: Otolaryngology | Admitting: Otolaryngology

## 2019-08-07 DIAGNOSIS — J329 Chronic sinusitis, unspecified: Secondary | ICD-10-CM

## 2019-08-21 ENCOUNTER — Ambulatory Visit (INDEPENDENT_AMBULATORY_CARE_PROVIDER_SITE_OTHER): Payer: PRIVATE HEALTH INSURANCE | Admitting: Otolaryngology

## 2019-08-21 ENCOUNTER — Encounter (INDEPENDENT_AMBULATORY_CARE_PROVIDER_SITE_OTHER): Payer: Self-pay | Admitting: Otolaryngology

## 2019-08-21 ENCOUNTER — Other Ambulatory Visit: Payer: Self-pay

## 2019-08-21 VITALS — Temp 97.7°F

## 2019-08-21 DIAGNOSIS — J301 Allergic rhinitis due to pollen: Secondary | ICD-10-CM

## 2019-08-21 DIAGNOSIS — J310 Chronic rhinitis: Secondary | ICD-10-CM

## 2019-08-21 NOTE — Progress Notes (Signed)
HPI: Lynn Aguirre is a 49 y.o. female who returns today for evaluation of chronic sinus complaints and ear complaints.  Symptoms are much worse when she has allergies.  She is presently using Flonase.  She had a CT scan of her sinuses because she has previously been diagnosed with sinus infections.  I reviewed the CT scan with her and the translator.  This demonstrated clear paranasal sinuses throughout with no obstruction and no abnormalities.  Middle ear and mastoids were clear bilaterally..  Past Medical History:  Diagnosis Date  . Allergy    Past Surgical History:  Procedure Laterality Date  . CESAREAN SECTION     Social History   Socioeconomic History  . Marital status: Married    Spouse name: Not on file  . Number of children: 4  . Years of education: Not on file  . Highest education level: 9th grade  Occupational History  . Not on file  Tobacco Use  . Smoking status: Never Smoker  . Smokeless tobacco: Never Used  Substance and Sexual Activity  . Alcohol use: No  . Drug use: No  . Sexual activity: Yes    Birth control/protection: None, Condom  Other Topics Concern  . Not on file  Social History Narrative  . Not on file   Social Determinants of Health   Financial Resource Strain:   . Difficulty of Paying Living Expenses:   Food Insecurity:   . Worried About Charity fundraiser in the Last Year:   . Arboriculturist in the Last Year:   Transportation Needs: No Transportation Needs  . Lack of Transportation (Medical): No  . Lack of Transportation (Non-Medical): No  Physical Activity:   . Days of Exercise per Week:   . Minutes of Exercise per Session:   Stress:   . Feeling of Stress :   Social Connections:   . Frequency of Communication with Friends and Family:   . Frequency of Social Gatherings with Friends and Family:   . Attends Religious Services:   . Active Member of Clubs or Organizations:   . Attends Archivist Meetings:   Marland Kitchen Marital Status:     Family History  Problem Relation Age of Onset  . Diabetes Father    No Known Allergies Prior to Admission medications   Medication Sig Start Date End Date Taking? Authorizing Provider  acetic acid-hydrocortisone (VOSOL-HC) OTIC solution Place 3 drops into the left ear 3 (three) times daily. 06/15/19  Yes Horald Pollen, MD  azithromycin [facility]) 250 MG tablet Sig as indicated 06/08/19  Yes Sagardia, Ines Bloomer, MD  cetirizine (ZYRTEC) 10 MG tablet Take 1 tablet (10 mg total) by mouth daily. 01/28/17  Yes English, Stephanie D, PA  fluticasone (FLONASE) 50 MCG/ACT nasal spray Place 2 sprays into both nostrils daily. 01/28/17  Yes English, Colletta Maryland D, PA  loratadine (CLARITIN) 10 MG tablet Take 10 mg by mouth daily as needed for allergies. Reported on 09/19/2015   Yes [provider]  methylPREDNISolone (MEDROL DOSEPAK) 4 MG TBPK tablet Sig as indicated 07/08/19  Yes Sagardia, Ines Bloomer, MD  omeprazole (PRILOSEC) 40 MG capsule Take 1 capsule (40 mg total) by mouth daily. 12/31/16  Yes English, Colletta Maryland D, PA  simethicone (MYLANTA GAS) 125 MG chewable tablet Chew 125 mg by mouth every 6 (six) hours as needed for flatulence.   Yes [provider]  sodium chloride (OCEAN) 0.65 % SOLN nasal spray Place 1 spray into both nostrils as  needed for congestion. 06/08/19  Yes Sagardia, Eilleen Kempf, MD  triamcinolone (NASACORT) 55 MCG/ACT AERO nasal inhaler Place 2 sprays into the nose daily. 07/08/19  Yes Sagardia, Eilleen Kempf, MD     Positive ROS: Otherwise negative  All other systems have been reviewed and were otherwise negative with the exception of those mentioned in the HPI and as above.  Physical Exam: Constitutional: Alert, well-appearing, no acute distress Ears: External ears without lesions or tenderness. Ear canals are clear bilaterally with intact, clear TMs bilaterally. Nasal: External nose without lesions. Septum midline with mild rhinitis and clear mucus  discharge.. Clear nasal passages bilaterally. Oral: Lips and gums without lesions. Tongue and palate mucosa without lesions. Posterior oropharynx clear. Neck: No palpable adenopathy or masses Respiratory: Breathing comfortably  Skin: No facial/neck lesions or rash noted.  Procedures  Assessment: Allergic rhinitis. Normal CT scan of the sinuses.  Plan: Reviewed with patient that she might be better treated by seeing an allergist since she is not getting adequate relief by using nasal steroid sprays and antihistamines which I recommended.   Narda Bonds, MD

## 2019-11-05 ENCOUNTER — Other Ambulatory Visit: Payer: Self-pay | Admitting: Obstetrics and Gynecology

## 2019-11-05 DIAGNOSIS — Z1231 Encounter for screening mammogram for malignant neoplasm of breast: Secondary | ICD-10-CM

## 2019-11-11 ENCOUNTER — Ambulatory Visit
Admission: RE | Admit: 2019-11-11 | Discharge: 2019-11-11 | Disposition: A | Discharge status: Home / Self Care | Payer: No Typology Code available for payment source | Source: Ambulatory Visit | Attending: Obstetrics and Gynecology | Admitting: Obstetrics and Gynecology

## 2019-11-11 DIAGNOSIS — Z1231 Encounter for screening mammogram for malignant neoplasm of breast: Secondary | ICD-10-CM

## 2020-02-23 NOTE — Progress Notes (Signed)
New Patient Note  RE: Lynn Aguirre MRN: [id number] DOB: 1970-06-11 Date of Office Visit: 02/24/2020  Referring provider: No ref. provider found Primary care provider: Georgina Quint, MD  Chief Complaint: Allergy Testing, Nasal Congestion, and Ear Pain  History of Present Illness: I had the pleasure of seeing Lynn Aguirre for initial evaluation at the [facility] on 02/24/2020. She is a 49 y.o. female, who is self-referred here by Georgina Quint, MD for the evaluation of rhinitis. Spanish interpreter present in person.   She reports symptoms of nasal congestion, dry nose, sneezing, PND, sore throat, dizziness, ear pain/pressure. Symptoms have been going on for 3-4 years. The symptoms are present all year around. Other triggers include exposure to strong scents. Anosmia: no. Headache: sometimes. She has used benadryl, Nasacort and prednisone with some improvement in symptoms. Sinus infections: no. Previous work up includes: at age 83 she had skin testing in [place] and was on allergy injections for about 1 year with good benefit. Patient in unsure of results. Previous ENT evaluation: yes. Previous sinus imaging: 08/07/2019 CT sinus - negative. History of nasal polyps: no. History of reflux: in the past.  07/27/2019 ENT visit: "Plan: Recommended use of Nasacort 2 sprays each nostril at night. She has saline rinse that she can use on a as needed basis. We will plan on scheduling a CT scan of the sinuses to better evaluate the sinuses and have her follow-up following the CT scan to review this."  Assessment and Plan: [name] is a 49 y.o. female with: Other allergic rhinitis Perennial rhinitis symptoms with main complaint of ear pain/pressure for 3 to 4 years.  Tried Benadryl, Nasacort and prednisone with some benefit.  Patient was on allergy injections for about 1 year in [place] in her 75s with good benefit.  Recent ENT evaluation with CT sinus was  unremarkable.  Concerned if there are environmental allergy triggers contributing to her symptoms.  Today's intradermal skin testing showed: Positive to perennial molds only - denies having any mold/water damage issues at her home.  Discussed with patient that I'm not convinced that the above allergens are causing her current symptoms.   Start environmental control measures as below.  May use over the counter antihistamines such as Xyzal (levocetirizine) daily as needed.  May use Nasonex (mometasone) nasal spray 1 spray per nostril twice a day as needed for nasal congestion.   Nasal saline spray (i.e., Simply Saline) or nasal saline lavage (i.e., NeilMed) is recommended as needed and prior to medicated nasal sprays.  Sensation of fullness in both ears Ear fullness/pressure/pain and dizziness. Denies hearing loss.   Possible eustachian tube dysfunction given symptoms.   Start Nasonex (mometasone) nasal spray 1 spray per nostril twice a day as above and hopefully this will help with the ear symptoms as well.   Ears looked normal on today's exam.  If no improvement in symptoms then will refer back to ENT for further evaluation.   Return in about 3 months (around 05/26/2020).  Meds ordered this encounter  Medications  . mometasone (NASONEX) 50 MCG/ACT nasal spray    Sig: Place 1 spray into the nose in the morning and at bedtime. For nasal congestion.    Dispense:  1 each    Refill:  5  . levocetirizine (XYZAL) 5 MG tablet    Sig: Take 1 tablet (5 mg total) by mouth every evening.    Dispense:  30 tablet    Refill:  5   Other allergy screening: Asthma: no Rhino conjunctivitis: yes Food allergy: no Medication allergy: no Hymenoptera allergy: no Urticaria: no Eczema:no History of recurrent infections suggestive of immunodeficency: no  Diagnostics: Skin Testing: Environmental allergy panel. Positive test to: perennial mold. Results discussed with patient/family.  Airborne  Adult Perc - 02/24/20 1405    Time Antigen Placed 1405    Allergen Manufacturer Waynette Buttery    Location Back    Number of Test 59    Panel 1 Select    1. Control-Buffer 50% Glycerol Negative    2. Control-Histamine 1 mg/ml 2+    3. Albumin saline Negative    4. Bahia Negative    5. French Southern Territories Negative    6. Johnson Negative    7. Kentucky Blue Negative    8. Meadow Fescue Negative    9. Perennial Rye Negative    10. Sweet Vernal Negative    11. Timothy Negative    12. Cocklebur Negative    13. Burweed Marshelder Negative    14. Ragweed, short Negative    15. Ragweed, Giant Negative    16. Plantain,  English Negative    17. Lamb's Quarters Negative    18. Sheep Sorrell Negative    19. Rough Pigweed Negative    20. Marsh Elder, Rough Negative    21. Mugwort, Common Negative    22. Ash mix Negative    23. Birch mix Negative    24. Beech American Negative    25. Box, Elder Negative    26. Cedar, red Negative    27. Cottonwood, Guinea-Bissau Negative    28. Elm mix Negative    29. Hickory Negative    30. Maple mix Negative    31. Oak, Guinea-Bissau mix Negative    32. Pecan Pollen Negative    33. Pine mix Negative    34. Sycamore Eastern Negative    35. Walnut, Black Pollen Negative    36. Alternaria alternata Negative    37. Cladosporium Herbarum Negative    38. Aspergillus mix Negative    39. Penicillium mix Negative    40. Bipolaris sorokiniana (Helminthosporium) Negative    41. Drechslera spicifera (Curvularia) Negative    42. Mucor plumbeus Negative    43. Fusarium moniliforme Negative    44. Aureobasidium pullulans (pullulara) Negative    45. Rhizopus oryzae Negative    46. Botrytis cinera Negative    47. Epicoccum nigrum Negative    48. Phoma betae Negative    49. Candida Albicans Negative    50. Trichophyton mentagrophytes Negative    51. Mite, D Farinae  5,000 AU/ml Negative    52. Mite, D Pteronyssinus  5,000 AU/ml Negative    53. Cat Hair 10,000 BAU/ml Negative    54.  Dog  Epithelia Negative    55. Mixed Feathers Negative    56. Horse Epithelia Negative    57. Cockroach, German Negative    58. Mouse Negative    59. Tobacco Leaf Negative          Intradermal - 02/24/20 1434    Time Antigen Placed 1434    Allergen Manufacturer Greer    Location Arm    Number of Test 15    Intradermal Select    Control Negative    French Southern Territories Negative    Johnson Negative    7 Grass Negative    Ragweed mix Negative    Weed mix Negative    Tree mix Negative    Mold 1  Negative    Mold 2 2+    Mold 3 Negative    Mold 4 2+    Cat Negative    Dog Negative    Cockroach Negative    Mite mix Negative           Past Medical History: Patient Active Problem List   Diagnosis Date Noted  . Other allergic rhinitis 02/24/2020  . Sensation of fullness in both ears 02/24/2020  . Gastroesophageal reflux disease 01/29/2017  . Seasonal allergies 01/29/2017   Past Medical History:  Diagnosis Date  . Allergy    Past Surgical History: Past Surgical History:  Procedure Laterality Date  . CESAREAN SECTION  2002  . CESAREAN SECTION     Medication List:  Current Outpatient Medications  Medication Sig Dispense Refill  . acetic acid-hydrocortisone (VOSOL-HC) OTIC solution Place 3 drops into the left ear 3 (three) times daily. 10 mL 1  . omeprazole (PRILOSEC) 40 MG capsule Take 1 capsule (40 mg total) by mouth daily. 30 capsule 3  . azithromycin (ZITHROMAX) 250 MG tablet Sig as indicated (Patient not taking: Reported on 02/24/2020) 6 tablet 0  . levocetirizine (XYZAL) 5 MG tablet Take 1 tablet (5 mg total) by mouth every evening. 30 tablet 5  . loratadine (CLARITIN) 10 MG tablet Take 10 mg by mouth daily as needed for allergies. Reported on 09/19/2015 (Patient not taking: Reported on 02/24/2020)    . methylPREDNISolone (MEDROL DOSEPAK) 4 MG TBPK tablet Sig as indicated (Patient not taking: Reported on 02/24/2020) 21 tablet 1  . mometasone (NASONEX) 50 MCG/ACT nasal spray Place 1  spray into the nose in the morning and at bedtime. For nasal congestion. 1 each 5  . simethicone (MYLANTA GAS) 125 MG chewable tablet Chew 125 mg by mouth every 6 (six) hours as needed for flatulence. (Patient not taking: Reported on 02/24/2020)    . sodium chloride (OCEAN) 0.65 % SOLN nasal spray Place 1 spray into both nostrils as needed for congestion. (Patient not taking: Reported on 02/24/2020) 30 mL 1  . triamcinolone (NASACORT) 55 MCG/ACT AERO nasal inhaler Place 2 sprays into the nose daily. (Patient not taking: Reported on 02/24/2020) 1 Inhaler 12   No current facility-administered medications for this visit.   Allergies: No Known Allergies Social History: Social History   Socioeconomic History  . Marital status: Married    Spouse name: Not on file  . Number of children: 4  . Years of education: Not on file  . Highest education level: 9th grade  Occupational History  . Not on file  Tobacco Use  . Smoking status: Never Smoker  . Smokeless tobacco: Never Used  Vaping Use  . Vaping Use: Never used  Substance and Sexual Activity  . Alcohol use: No  . Drug use: No  . Sexual activity: Yes    Birth control/protection: None, Condom  Other Topics Concern  . Not on file  Social History Narrative  . Not on file   Social Determinants of Health   Financial Resource Strain:   . Difficulty of Paying Living Expenses: Not on file  Food Insecurity:   . Worried About Programme researcher, broadcasting/film/video in the Last Year: Not on file  . Ran Out of Food in the Last Year: Not on file  Transportation Needs:   . Lack of Transportation (Medical): Not on file  . Lack of Transportation (Non-Medical): Not on file  Physical Activity:   . Days of Exercise per Week: Not on file  .  Minutes of Exercise per Session: Not on file  Stress:   . Feeling of Stress : Not on file  Social Connections:   . Frequency of Communication with Friends and Family: Not on file  . Frequency of Social Gatherings with Friends  and Family: Not on file  . Attends Religious Services: Not on file  . Active Member of Clubs or Organizations: Not on file  . Attends Banker Meetings: Not on file  . Marital Status: Not on file   Lives in a 50 year old home. Smoking: denies Occupation: foam Proofreader HistoryLibrarian, academic in the house: no Carpet in the family room: no Carpet in the bedroom: no Heating: electric Cooling: central Pet: no  Family History: Family History  Problem Relation Age of Onset  . Diabetes Father    Problem                               Relation Asthma                                   No  Eczema                                No  Food allergy                          No  Allergic rhino conjunctivitis     No   Review of Systems  Constitutional: Negative for appetite change, chills, fever and unexpected weight change.  HENT: Positive for congestion, sinus pressure, sneezing and sore throat. Negative for rhinorrhea.   Eyes: Negative for itching.  Respiratory: Negative for cough, chest tightness, shortness of breath and wheezing.   Cardiovascular: Negative for chest pain.  Gastrointestinal: Negative for abdominal pain.  Genitourinary: Negative for difficulty urinating.  Skin: Negative for rash.  Allergic/Immunologic: Positive for environmental allergies.  Neurological: Positive for headaches.   Objective: BP 96/70 (BP Location: Left Arm, Patient Position: Sitting, Cuff Size: Normal)   Pulse 67   Temp 97.9 F (36.6 C) (Temporal)   Resp 16   Ht 4\' 11"  (1.499 m)   Wt 204 lb (92.5 kg)   SpO2 94%   BMI 41.20 kg/m  Body mass index is 41.2 kg/m. Physical Exam Vitals and nursing note reviewed.  Constitutional:      Appearance: Normal appearance. She is well-developed.  HENT:     Head: Normocephalic and atraumatic.     Right Ear: External ear normal.     Left Ear: External ear normal.     Nose: Nose normal.     Mouth/Throat:     Mouth:  Mucous membranes are moist.     Pharynx: Oropharynx is clear.  Eyes:     Conjunctiva/sclera: Conjunctivae normal.  Cardiovascular:     Rate and Rhythm: Normal rate and regular rhythm.     Heart sounds: Normal heart sounds. No murmur heard.  No friction rub. No gallop.   Pulmonary:     Effort: Pulmonary effort is normal.     Breath sounds: Normal breath sounds. No wheezing, rhonchi or rales.  Abdominal:     Palpations: Abdomen is soft.  Musculoskeletal:     Cervical back: Neck supple.  Skin:  General: Skin is warm.     Findings: No rash.  Neurological:     Mental Status: She is alert and oriented to person, place, and time.  Psychiatric:        Behavior: Behavior normal.    The plan was reviewed with the patient/family, and all questions/concerned were addressed.  It was my pleasure to see Byrd HesselbachMaria today and participate in her care. Please feel free to contact me with any questions or concerns.  Sincerely,  Wyline MoodYoon Elzia Hott, DO Allergy & Immunology  [facility] [place] office: [phone] [facility] office: [phone]

## 2020-02-24 ENCOUNTER — Ambulatory Visit (INDEPENDENT_AMBULATORY_CARE_PROVIDER_SITE_OTHER): Payer: No Typology Code available for payment source | Admitting: Allergy

## 2020-02-24 ENCOUNTER — Encounter: Payer: Self-pay | Admitting: Allergy

## 2020-02-24 ENCOUNTER — Other Ambulatory Visit: Payer: Self-pay

## 2020-02-24 VITALS — BP 96/70 | HR 67 | Temp 97.9°F | Resp 16 | Ht 59.0 in | Wt 204.0 lb

## 2020-02-24 DIAGNOSIS — J3089 Other allergic rhinitis: Secondary | ICD-10-CM | POA: Diagnosis not present

## 2020-02-24 DIAGNOSIS — H938X3 Other specified disorders of ear, bilateral: Secondary | ICD-10-CM | POA: Insufficient documentation

## 2020-02-24 MED ORDER — MOMETASONE FUROATE 50 MCG/ACT NA SUSP: 1.0000 | Freq: Two times a day (BID) | NASAL | 5 refills | Status: DC

## 2020-02-24 MED ORDER — LEVOCETIRIZINE DIHYDROCHLORIDE 5 MG PO TABS: 5.0000 mg | ORAL_TABLET | Freq: Every evening | ORAL | 5 refills | Status: DC

## 2020-02-24 NOTE — Patient Instructions (Addendum)
Today's skin testing showed: Positive to perennial mold.  Environmental allergies  Start environmental control measures as below.  May use over the counter antihistamines such as Xyzal (levocetirizine) daily as needed.  May use Nasonex (mometasone) nasal spray 1 spray per nostril twice a day as needed for nasal congestion.   This can also help with the ear fullness.  Nasal saline spray (i.e., Simply Saline) or nasal saline lavage (i.e., NeilMed) is recommended as needed and prior to medicated nasal sprays.  Ears:  Ears look normal on exam today.  If you have persistent problems, please follow up with ENT again.  Follow up in 3 months or sooner if needed.   Mold Control . Mold and fungi can grow on a variety of surfaces provided certain temperature and moisture conditions exist.  . Outdoor molds grow on plants, decaying vegetation and soil. The major outdoor mold, Alternaria and Cladosporium, are found in very high numbers during hot and dry conditions. Generally, a late summer - fall peak is seen for common outdoor fungal spores. Rain will temporarily lower outdoor mold spore count, but counts rise rapidly when the rainy period ends. . The most important indoor molds are Aspergillus and Penicillium. Dark, humid and poorly ventilated basements are ideal sites for mold growth. The next most common sites of mold growth are the bathroom and the kitchen. Outdoor (Seasonal) Mold Control . Use air conditioning and keep windows closed. . Avoid exposure to decaying vegetation. Marland Kitchen Avoid leaf raking. . Avoid grain handling. . Consider wearing a face mask if working in moldy areas.  Indoor (Perennial) Mold Control  . Maintain humidity below 50%. . Get rid of mold growth on hard surfaces with water, detergent and, if necessary, 5% bleach (do not mix with other cleaners). Then dry the area completely. If mold covers an area more than 10 square feet, consider hiring an indoor environmental  professional. . For clothing, washing with soap and water is best. If moldy items cannot be cleaned and dried, throw them away. . Remove sources e.g. contaminated carpets. . Repair and seal leaking roofs or pipes. Using dehumidifiers in damp basements may be helpful, but empty the water and clean units regularly to prevent mildew from forming. All rooms, especially basements, bathrooms and kitchens, require ventilation and cleaning to deter mold and mildew growth. Avoid carpeting on concrete or damp floors, and storing items in damp areas.

## 2020-02-24 NOTE — Assessment & Plan Note (Addendum)
Ear fullness/pressure/pain and dizziness. Denies hearing loss.   Possible eustachian tube dysfunction given symptoms.   Start Nasonex (mometasone) nasal spray 1 spray per nostril twice a day as above and hopefully this will help with the ear symptoms as well.   Ears looked normal on today's exam.  If no improvement in symptoms then will refer back to ENT for further evaluation.

## 2020-02-24 NOTE — Assessment & Plan Note (Signed)
Perennial rhinitis symptoms with main complaint of ear pain/pressure for 3 to 4 years.  Tried Benadryl, Nasacort and prednisone with some benefit.  Patient was on allergy injections for about 1 year in [place] in her 83s with good benefit.  Recent ENT evaluation with CT sinus was unremarkable.  Concerned if there are environmental allergy triggers contributing to her symptoms.  Today's intradermal skin testing showed: Positive to perennial molds only - denies having any mold/water damage issues at her home.  Discussed with patient that I'm not convinced that the above allergens are causing her current symptoms.   Start environmental control measures as below.  May use over the counter antihistamines such as Xyzal (levocetirizine) daily as needed.  May use Nasonex (mometasone) nasal spray 1 spray per nostril twice a day as needed for nasal congestion.   Nasal saline spray (i.e., Simply Saline) or nasal saline lavage (i.e., NeilMed) is recommended as needed and prior to medicated nasal sprays.

## 2020-03-09 ENCOUNTER — Ambulatory Visit: Payer: No Typology Code available for payment source | Admitting: Emergency Medicine

## 2020-03-09 ENCOUNTER — Encounter: Payer: Self-pay | Admitting: Emergency Medicine

## 2020-03-09 ENCOUNTER — Other Ambulatory Visit: Payer: Self-pay

## 2020-03-09 VITALS — BP 114/77 | HR 76 | Temp 97.8°F | Resp 16 | Ht 64.0 in | Wt 208.0 lb

## 2020-03-09 DIAGNOSIS — Z23 Encounter for immunization: Secondary | ICD-10-CM

## 2020-03-09 DIAGNOSIS — M722 Plantar fascial fibromatosis: Secondary | ICD-10-CM

## 2020-03-09 MED ORDER — DICLOFENAC SODIUM 75 MG PO TBEC: 75.0000 mg | DELAYED_RELEASE_TABLET | Freq: Two times a day (BID) | ORAL | 0 refills | Status: DC

## 2020-03-09 NOTE — Patient Instructions (Addendum)
If you have lab work done today you will be contacted with your lab results within the next 2 weeks.  If you have not heard from [place] then please contact us. The fastest way to get your results is to register for My Chart.   IF you received an x-ray today, you will receive an invoice from [facility]. Please contact [facility] at [phone] with questions or concerns regarding your invoice.   IF you received labwork today, you will receive an invoice from [facility]. Please contact [facility] at [phone] with questions or concerns regarding your invoice.   Our billing staff will not be able to assist you with questions regarding bills from these companies.  You will be contacted with the lab results as soon as they are available. The fastest way to get your results is to activate your My Chart account. Instructions are located on the last page of this paperwork. If you have not heard from [place] regarding the results in 2 weeks, please contact this office.     Fascitis plantar Plantar Fasciitis  La fascitis plantar es una afeccin dolorosa que se produce en el taln. Ocurre cuando la banda de tejido que [employer] dedos con el hueso del taln (fascia plantar) se irrita. Esto puede ocurrir por Academic librarian ejercicio u otras actividades repetitivas (lesin por uso excesivo). El dolor de la fascitis plantar puede abarcar desde una leve irritacin a dolor intenso, y en los casos ms agudos puede dificultar que la persona camine o se [place]. Por lo general, el dolor es peor a la maana despus de dormir, o despus de Personal assistant sentado o acostado durante un tiempo. El dolor tambin puede empeorar despus de caminar o estar de pie por [employer]. Cules son las causas? Esta afeccin puede ser causada por lo siguiente:  Estar de pie durante largos perodos.  Usar zapatos que no tengan un buen soporte para el arco.  Practicar actividades que ponen tensin en las  articulaciones (actividades de alto impacto), como correr, Radio producer ejercicios aerbicos o Market researcher.  Tener sobrepeso.  Tener una forma de caminar (un andar) anormal.  Presentar rigidez muscular en la parte posterior de la parte inferior de la pierna (pantorrilla).  Tener un arco alto de los pies.  Comenzar una nueva actividad fsica. Cules son los signos o los sntomas? El sntoma principal de esta afeccin es el dolor en el taln. El dolor:  Puede empeorar con los primeros pasos luego de estar en reposo, especialmente por la maana despus de dormir o de haber estado sentado o acostado durante [place].  Puede empeorar despus de largos perodos de estar parado.  Puede disminuir despus de 30 a 45 minutos de [place], como caminar apaciblemente. Cmo se diagnostica? Esta afeccin puede diagnosticarse en funcin de sus antecedentes mdicos y sus sntomas. Su mdico puede preguntarle sobre su nivel de [place]. El Production manager un examen fsico para controlar si tiene lo siguiente:  Un rea dolorida en la parte inferior del pie.  El arco alto.  Dolor al Doctor, general practice.  Dificultad para mover el pie. Pueden realizarle estudios de diagnstico por imagen para confirmar el diagnstico, por ejemplo:  Radiografas.  Ecografa.  Resonancia magntica (RM). Cmo se trata? El tratamiento de la fascitis plantar depende de la gravedad de su afeccin. El tratamiento puede incluir lo siguiente:  Reposo, hielo, presin (compresin) y Lexicographer el pie afectado (elevacin). Esto puede conocerse como Terapia de RHCE (Reposo, hielo, compresin, elevacin).  El mdico puede recomendarle Terapia de RHCE junto con medicamentos de venta libre para Engineer, materials.  Ejercicios para estirar las pantorrillas y la fascia plantar.  Una frula que [employer] estirado y [place] mientras usted duerme (frula nocturna).  Fisioterapia para [employer] sntomas y Geophysical data processor en el futuro.  Inyecciones de corticoesteroides para Engineer, materials y la inflamacin.  Estimular su fascia plantar lesionada con impulsos elctricos (tratamiento con ondas de choque extracorprea). Esto generalmente es la ltima opcin de tratamiento antes de la [place].  [place], si los otros tratamientos no han funcionado despus de . Siga estas indicaciones en su casa:  Control del dolor, la rigidez y la hinchazn  Si se lo indican, aplique hielo sobre la zona del dolor: ? Ponga el hielo en una bolsa de plstico o use una botella de agua congelada. ? Coloque una [employer] piel y la bolsa de hielo o la botella. ? Frote la parte inferior del pie sobre la bolsa o la botella. ? Haga esto durante , de 2a 3veces al [employer].  Use calzado deportivo con amortiguacin de aire o gel, o intente usar plantillas blandas diseadas para la fascitis plantar.  Cuando est sentado o acostado, levante (eleve) el pie por encima del nivel del corazn. Actividad  Evite las actividades que causan dolor. Pregntele al mdico qu actividades son seguras para usted.  Haga los ejercicios de fisioterapia y estiramiento como se lo haya indicado el mdico.  Intente hacer actividades y tipos de ejercicio que sean ms fciles para las articulaciones (de bajo impacto). Por ejemplo, nadar, hacer ejercicios [employer], y Lobbyist. Instrucciones generales  [employer] de venta libre y los recetados solamente como se lo haya indicado el mdico.  Si el mdico se lo indica, use una frula nocturna para dormir. Afloje la frula si los dedos de los pies se le entumecen, siente hormigueos o se le enfran y se tornan de Research officer, trade union.  Mantenga un peso saludable, o colabore con su mdico para perder [employer].  Concurra a todas las visitas de control como se lo haya indicado el mdico. Esto es importante. Comunquese con un mdico si:  Tiene sntomas que no  desaparecen despus tratarlos en su casa.  Siente un dolor que [place].  Siente un dolor que afecta su capacidad de moverse o de Education officer, environmental sus actividades diarias. Resumen  La fascitis plantar es una afeccin dolorosa que se produce en el taln. Ocurre cuando la banda de tejido que [employer] dedos con el hueso del taln (fascia plantar) se irrita.  El sntoma principal de esta afeccin es Chief Technology Officer en el taln que puede empeorar despus de hacer mucho ejercicio o de Personal assistant parado por mucho tiempo.  El tratamiento vara, pero por lo general comienza con reposo, hielo, compresin, y elevacin (Tratamiento de RHCE) y los medicamentos de venta libre para Human resources officer. Esta informacin no tiene Theme park manager el consejo del mdico. Asegrese de hacerle al mdico cualquier pregunta que tenga. Document Revised: 07/13/2017 Document Reviewed: 03/24/2017 Elsevier Patient Education  2020 [employer].

## 2020-03-09 NOTE — Progress Notes (Signed)
Lynn Aguirre 49 y.o.   Chief Complaint  Patient presents with  . Foot Pain    and heel 4 weeks ago and 2 weeks it started getting painful    HISTORY OF PRESENT ILLNESS: This is a 49 y.o. female complaining of bilateral heel pain that started 4 weeks ago increasingly painful in the last 2 weeks. Works 8 hours/day standing up.  Denies direct injury. No other complaints or medical concerns today.  HPI   Prior to Admission medications   Medication Sig Start Date End Date Taking? Authorizing Provider  acetic acid-hydrocortisone (VOSOL-HC) OTIC solution Place 3 drops into the left ear 3 (three) times daily. 06/15/19  Yes Rosezetta Balderston, Eilleen Kempf, MD  mometasone (NASONEX) 50 MCG/ACT nasal spray Place 1 spray into the nose in the morning and at bedtime. For nasal congestion. 02/24/20  Yes Ellamae Sia, DO  omeprazole (PRILOSEC) 40 MG capsule Take 1 capsule (40 mg total) by mouth daily. 12/31/16  Yes English, Judeth Cornfield D, PA  sodium chloride (OCEAN) 0.65 % SOLN nasal spray Place 1 spray into both nostrils as needed for congestion. 06/08/19  Yes Isak Sotomayor, Eilleen Kempf, MD  levocetirizine (XYZAL) 5 MG tablet Take 1 tablet (5 mg total) by mouth every evening. Patient not taking: Reported on 03/09/2020 02/24/20   Ellamae Sia, DO  loratadine (CLARITIN) 10 MG tablet Take 10 mg by mouth daily as needed for allergies. Reported on 09/19/2015 Patient not taking: Reported on 03/09/2020    [provider]  methylPREDNISolone (MEDROL DOSEPAK) 4 MG TBPK tablet Sig as indicated Patient not taking: Reported on 03/09/2020 07/08/19   Georgina Quint, MD  simethicone (MYLANTA GAS) 125 MG chewable tablet Chew 125 mg by mouth every 6 (six) hours as needed for flatulence.  Patient not taking: Reported on 03/09/2020    [provider]  triamcinolone (NASACORT) 55 MCG/ACT AERO nasal inhaler Place 2 sprays into the nose daily. Patient not taking: Reported on 03/09/2020 07/08/19   Georgina Quint,  MD    No Known Allergies  Patient Active Problem List   Diagnosis Date Noted  . Other allergic rhinitis 02/24/2020  . Sensation of fullness in both ears 02/24/2020  . Gastroesophageal reflux disease 01/29/2017  . Seasonal allergies 01/29/2017    Past Medical History:  Diagnosis Date  . Allergy     Past Surgical History:  Procedure Laterality Date  . CESAREAN SECTION  2002  . CESAREAN SECTION      Social History   Socioeconomic History  . Marital status: Married    Spouse name: Not on file  . Number of children: 4  . Years of education: Not on file  . Highest education level: 9th grade  Occupational History  . Not on file  Tobacco Use  . Smoking status: Never Smoker  . Smokeless tobacco: Never Used  Vaping Use  . Vaping Use: Never used  Substance and Sexual Activity  . Alcohol use: No  . Drug use: No  . Sexual activity: Yes    Birth control/protection: None, Condom  Other Topics Concern  . Not on file  Social History Narrative  . Not on file   Social Determinants of Health   Financial Resource Strain:   . Difficulty of Paying Living Expenses: Not on file  Food Insecurity:   . Worried About Programme researcher, broadcasting/film/video in the Last Year: Not on file  . Ran Out of Food in the Last Year: Not on file  Transportation Needs:   .  Lack of Transportation (Medical): Not on file  . Lack of Transportation (Non-Medical): Not on file  Physical Activity:   . Days of Exercise per Week: Not on file  . Minutes of Exercise per Session: Not on file  Stress:   . Feeling of Stress : Not on file  Social Connections:   . Frequency of Communication with Friends and Family: Not on file  . Frequency of Social Gatherings with Friends and Family: Not on file  . Attends Religious Services: Not on file  . Active Member of Clubs or Organizations: Not on file  . Attends Banker Meetings: Not on file  . Marital Status: Not on file  Intimate Partner Violence:   . Fear of  Current or Ex-Partner: Not on file  . Emotionally Abused: Not on file  . Physically Abused: Not on file  . Sexually Abused: Not on file    Family History  Problem Relation Age of Onset  . Diabetes Father      Review of Systems  Constitutional: Negative.  Negative for chills and fever.  HENT: Negative.  Negative for congestion and sore throat.   Respiratory: Negative.  Negative for cough and shortness of breath.   Cardiovascular: Negative.  Negative for chest pain and palpitations.  Gastrointestinal: Negative for abdominal pain, nausea and vomiting.  Genitourinary: Negative.  Negative for dysuria and hematuria.  Musculoskeletal: Negative.   Skin: Negative.  Negative for rash.  Neurological: Negative.  Negative for dizziness and headaches.  All other systems reviewed and are negative.  Today's Vitals   03/09/20 1628  BP: 114/77  Pulse: 76  Resp: 16  Temp: 97.8 F (36.6 C)  TempSrc: Temporal  SpO2: 96%  Weight: 208 lb (94.3 kg)  Height: 5\' 4"  (1.626 m)   Body mass index is 35.7 kg/m.   Physical Exam Vitals reviewed.  Constitutional:      Appearance: Normal appearance. She is obese.  HENT:     Head: Normocephalic.  Eyes:     Extraocular Movements: Extraocular movements intact.     Pupils: Pupils are equal, round, and reactive to light.  Cardiovascular:     Rate and Rhythm: Normal rate.  Pulmonary:     Effort: Pulmonary effort is normal.  Musculoskeletal:     Cervical back: Normal range of motion.     Comments: Feet: Warm to touch.  No erythema or skin lesions.  Excellent distal circulation with good capillary refill and sensation.  Positive tenderness to both heels area.   Skin:    General: Skin is warm and dry.     Capillary Refill: Capillary refill takes less than 2 seconds.  Neurological:     General: No focal deficit present.     Mental Status: She is alert and oriented to person, place, and time.  Psychiatric:        Mood and Affect: Mood normal.         Behavior: Behavior normal.      ASSESSMENT & PLAN: Rennie was seen today for foot pain.  Diagnoses and all orders for this visit:  Plantar fasciitis, bilateral -     diclofenac (VOLTAREN) 75 MG EC tablet; Take 1 tablet (75 mg total) by mouth 2 (two) times daily. -     Ambulatory referral to Podiatry  Need for prophylactic vaccination and inoculation against influenza -     Flu Vaccine QUAD 36+ mos IM    Patient Instructions       If you have  lab work done today you will be contacted with your lab results within the next 2 weeks.  If you have not heard from us then please contact us. The fastest way to get your results is to register for My Chart.   IF you received an x-ray today, you will receive an invoice from [facility]. Please contact [facility] at [phone] with questions or concerns regarding your invoice.   IF you received labwork today, you will receive an invoice from [facility]. Please contact [facility] at [phone] with questions or concerns regarding your invoice.   Our billing staff will not be able to assist you with questions regarding bills from these companies.  You will be contacted with the lab results as soon as they are available. The fastest way to get your results is to activate your My Chart account. Instructions are located on the last page of this paperwork. If you have not heard from us regarding the results in 2 weeks, please contact this office.     Fascitis plantar Plantar Fasciitis  La fascitis plantar es una afeccin dolorosa que se produce en el taln. Ocurre cuando la banda de tejido que [employer]cta los dedos con el hueso del taln (fascia plantar) se irrita. Esto puede ocurrir por Academic librarianhacer mucho ejercicio u otras actividades repetitivas (lesin por uso excesivo). El dolor de la fascitis plantar puede abarcar desde una leve irritacin a dolor intenso, y en los casos ms agudos puede dificultar que la persona camine o se  [place]. Por lo general, el dolor es peor a la maana despus de dormir, o despus de Personal assistantpermanecer sentado o acostado durante un tiempo. El dolor tambin puede empeorar despus de caminar o estar de pie por Con-waymucho tiempo. Cules son las causas? Esta afeccin puede ser causada por lo siguiente:  Estar de pie durante largos perodos.  Usar zapatos que no tengan un buen soporte para el arco.  Practicar actividades que ponen tensin en las articulaciones (actividades de alto impacto), como correr, Radio producerhacer ejercicios aerbicos o Market researcherpracticar ballet.  Tener sobrepeso.  Tener una forma de caminar (un andar) anormal.  Presentar rigidez muscular en la parte posterior de la parte inferior de la pierna (pantorrilla).  Tener un arco alto de los pies.  Comenzar una nueva actividad fsica. Cules son los signos o los sntomas? El sntoma principal de esta afeccin es el dolor en el taln. El dolor:  Puede empeorar con los primeros pasos luego de estar en reposo, especialmente por la maana despus de dormir o de haber estado sentado o acostado durante [place] tiempo.  Puede empeorar despus de largos perodos de estar parado.  Puede disminuir despus de 30 a 45 minutos de [place], como caminar apaciblemente. Cmo se diagnostica? Esta afeccin puede diagnosticarse en funcin de sus antecedentes mdicos y sus sntomas. Su mdico puede preguntarle sobre su nivel de [place]actividad. El Production managermdico le realizar un examen fsico para controlar si tiene lo siguiente:  Un rea dolorida en la parte inferior del pie.  El arco alto.  Dolor al Doctor, general practicemover el pie.  Dificultad para mover el pie. Pueden realizarle estudios de diagnstico por imagen para confirmar el diagnstico, por ejemplo:  Radiografas.  Ecografa.  Resonancia magntica (RM). Cmo se trata? El tratamiento de la fascitis plantar depende de la gravedad de su afeccin. El tratamiento puede incluir lo siguiente:  Reposo, hielo, presin (compresin) y  Lexicographerlevantar el pie afectado (elevacin). Esto puede conocerse como Terapia de RHCE (Reposo, hielo, compresin, elevacin). El mdico puede recomendarle Terapia  de RHCE junto con medicamentos de venta libre para Engineer, materials.  Ejercicios para estirar las pantorrillas y la fascia plantar.  Una frula que [employer] estirado y [place] mientras usted duerme (frula nocturna).  Fisioterapia para [employer] sntomas y Physiological scientist en el futuro.  Inyecciones de corticoesteroides para Engineer, materials y la inflamacin.  Estimular su fascia plantar lesionada con impulsos elctricos (tratamiento con ondas de choque extracorprea). Esto generalmente es la ltima opcin de tratamiento antes de la [place].  [place], si los otros tratamientos no han funcionado despus de . Siga estas indicaciones en su casa:  Control del dolor, la rigidez y la hinchazn  Si se lo indican, aplique hielo sobre la zona del dolor: ? Ponga el hielo en una bolsa de plstico o use una botella de agua congelada. ? Coloque una [employer] piel y la bolsa de hielo o la botella. ? Frote la parte inferior del pie sobre la bolsa o la botella. ? Haga esto durante , de 2a 3veces al [employer].  Use calzado deportivo con amortiguacin de aire o gel, o intente usar plantillas blandas diseadas para la fascitis plantar.  Cuando est sentado o acostado, levante (eleve) el pie por encima del nivel del corazn. Actividad  Evite las actividades que causan dolor. Pregntele al mdico qu actividades son seguras para usted.  Haga los ejercicios de fisioterapia y estiramiento como se lo haya indicado el mdico.  Intente hacer actividades y tipos de ejercicio que sean ms fciles para las articulaciones (de bajo impacto). Por ejemplo, nadar, hacer ejercicios [employer], y Lobbyist. Instrucciones generales  [employer] de venta libre y los recetados solamente como se lo haya  indicado el mdico.  Si el mdico se lo indica, use una frula nocturna para dormir. Afloje la frula si los dedos de los pies se le entumecen, siente hormigueos o se le enfran y se tornan de Research officer, trade union.  Mantenga un peso saludable, o colabore con su mdico para perder [employer].  Concurra a todas las visitas de control como se lo haya indicado el mdico. Esto es importante. Comunquese con un mdico si:  Tiene sntomas que no desaparecen despus tratarlos en su casa.  Siente un dolor que [place].  Siente un dolor que afecta su capacidad de moverse o de Education officer, environmental sus actividades diarias. Resumen  La fascitis plantar es una afeccin dolorosa que se produce en el taln. Ocurre cuando la banda de tejido que [employer] dedos con el hueso del taln (fascia plantar) se irrita.  El sntoma principal de esta afeccin es Chief Technology Officer en el taln que puede empeorar despus de hacer mucho ejercicio o de Personal assistant parado por mucho tiempo.  El tratamiento vara, pero por lo general comienza con reposo, hielo, compresin, y elevacin (Tratamiento de RHCE) y los medicamentos de venta libre para Human resources officer. Esta informacin no tiene Theme park manager el consejo del mdico. Asegrese de hacerle al mdico cualquier pregunta que tenga. Document Revised: 07/13/2017 Document Reviewed: 03/24/2017 Elsevier Patient Education  2020 Elsevier Inc.      Edwina Barth, MD [facility]

## 2020-03-23 ENCOUNTER — Ambulatory Visit (INDEPENDENT_AMBULATORY_CARE_PROVIDER_SITE_OTHER): Payer: No Typology Code available for payment source | Admitting: Podiatry

## 2020-03-23 ENCOUNTER — Other Ambulatory Visit: Payer: Self-pay

## 2020-03-23 ENCOUNTER — Ambulatory Visit (INDEPENDENT_AMBULATORY_CARE_PROVIDER_SITE_OTHER): Payer: No Typology Code available for payment source

## 2020-03-23 ENCOUNTER — Encounter: Payer: Self-pay | Admitting: Podiatry

## 2020-03-23 DIAGNOSIS — M722 Plantar fascial fibromatosis: Secondary | ICD-10-CM | POA: Diagnosis not present

## 2020-03-23 MED ORDER — TRIAMCINOLONE ACETONIDE 10 MG/ML IJ SUSP
10.0000 mg | Freq: Once | INTRAMUSCULAR | Status: AC
  Administered 2020-03-23: 10 mg

## 2020-03-23 MED ORDER — TRIAMCINOLONE ACETONIDE 10 MG/ML IJ SUSP
10.0000 mg | Freq: Once | INTRAMUSCULAR | Status: AC
Start: 2020-03-23 — End: 2020-03-23
  Administered 2020-03-23: 10 mg

## 2020-03-23 NOTE — Patient Instructions (Signed)
Fascitis plantar Plantar Fasciitis  La fascitis plantar es una afeccin dolorosa que se produce en el taln. Ocurre cuando la banda de tejido que conecta los dedos con el hueso del taln (fascia plantar) se irrita. Esto puede ocurrir por hacer mucho ejercicio u otras actividades repetitivas (lesin por uso excesivo). El dolor de la fascitis plantar puede abarcar desde una leve irritacin a dolor intenso, y en los casos ms agudos puede dificultar que la persona camine o se mueva. Por lo general, el dolor es peor a la maana despus de dormir, o despus de permanecer sentado o acostado durante un tiempo. El dolor tambin puede empeorar despus de caminar o estar de pie por mucho tiempo. Cules son las causas? Esta afeccin puede ser causada por lo siguiente:  Estar de pie durante largos perodos.  Usar zapatos que no tengan un buen soporte para el arco.  Practicar actividades que ponen tensin en las articulaciones (actividades de alto impacto), como correr, hacer ejercicios aerbicos o practicar ballet.  Tener sobrepeso.  Tener una forma de caminar (un andar) anormal.  Presentar rigidez muscular en la parte posterior de la parte inferior de la pierna (pantorrilla).  Tener un arco alto de los pies.  Comenzar una nueva actividad fsica. Cules son los signos o los sntomas? El sntoma principal de esta afeccin es el dolor en el taln. El dolor:  Puede empeorar con los primeros pasos luego de estar en reposo, especialmente por la maana despus de dormir o de haber estado sentado o acostado durante mucho tiempo.  Puede empeorar despus de largos perodos de estar parado.  Puede disminuir despus de 30 a 45 minutos de actividad, como caminar apaciblemente. Cmo se diagnostica? Esta afeccin puede diagnosticarse en funcin de sus antecedentes mdicos y sus sntomas. Su mdico puede preguntarle sobre su nivel de actividad. El mdico le realizar un examen fsico para controlar si tiene  lo siguiente:  Un rea dolorida en la parte inferior del pie.  El arco alto.  Dolor al mover el pie.  Dificultad para mover el pie. Pueden realizarle estudios de diagnstico por imagen para confirmar el diagnstico, por ejemplo:  Radiografas.  Ecografa.  Resonancia magntica (RM). Cmo se trata? El tratamiento de la fascitis plantar depende de la gravedad de su afeccin. El tratamiento puede incluir lo siguiente:  Reposo, hielo, presin (compresin) y levantar el pie afectado (elevacin). Esto puede conocerse como Terapia de RHCE (Reposo, hielo, compresin, elevacin). El mdico puede recomendarle Terapia de RHCE junto con medicamentos de venta libre para aliviar el dolor.  Ejercicios para estirar las pantorrillas y la fascia plantar.  Una frula que mantiene el pie estirado y hacia arriba mientras usted duerme (frula nocturna).  Fisioterapia para aliviar los sntomas y evitar problemas en el futuro.  Inyecciones de corticoesteroides para aliviar el dolor y la inflamacin.  Estimular su fascia plantar lesionada con impulsos elctricos (tratamiento con ondas de choque extracorprea). Esto generalmente es la ltima opcin de tratamiento antes de la ciruga.  Ciruga, si los otros tratamientos no han funcionado despus de 12meses. Siga estas indicaciones en su casa:  Control del dolor, la rigidez y la hinchazn  Si se lo indican, aplique hielo sobre la zona del dolor: ? Ponga el hielo en una bolsa de plstico o use una botella de agua congelada. ? Coloque una toalla entre la piel y la bolsa de hielo o la botella. ? Frote la parte inferior del pie sobre la bolsa o la botella. ? Haga esto durante 20minutos, de   2a 3veces al da.  Use calzado deportivo con amortiguacin de aire o gel, o intente usar plantillas blandas diseadas para la fascitis plantar.  Cuando est sentado o acostado, levante (eleve) el pie por encima del nivel del corazn. Actividad  Evite las  actividades que causan dolor. Pregntele al mdico qu actividades son seguras para usted.  Haga los ejercicios de fisioterapia y estiramiento como se lo haya indicado el mdico.  Intente hacer actividades y tipos de ejercicio que sean ms fciles para las articulaciones (de bajo impacto). Por ejemplo, nadar, hacer ejercicios aerbicos en el agua, y andar en bicicleta. Instrucciones generales  Tome los medicamentos de venta libre y los recetados solamente como se lo haya indicado el mdico.  Si el mdico se lo indica, use una frula nocturna para dormir. Afloje la frula si los dedos de los pies se le entumecen, siente hormigueos o se le enfran y se tornan de color azul.  Mantenga un peso saludable, o colabore con su mdico para perder peso.  Concurra a todas las visitas de control como se lo haya indicado el mdico. Esto es importante. Comunquese con un mdico si:  Tiene sntomas que no desaparecen despus tratarlos en su casa.  Siente un dolor que empeora.  Siente un dolor que afecta su capacidad de moverse o de realizar sus actividades diarias. Resumen  La fascitis plantar es una afeccin dolorosa que se produce en el taln. Ocurre cuando la banda de tejido que conecta los dedos con el hueso del taln (fascia plantar) se irrita.  El sntoma principal de esta afeccin es el dolor en el taln que puede empeorar despus de hacer mucho ejercicio o de permanecer parado por mucho tiempo.  El tratamiento vara, pero por lo general comienza con reposo, hielo, compresin, y elevacin (Tratamiento de RHCE) y los medicamentos de venta libre para controlar el dolor. Esta informacin no tiene como fin reemplazar el consejo del mdico. Asegrese de hacerle al mdico cualquier pregunta que tenga. Document Revised: 07/13/2017 Document Reviewed: 03/24/2017 Elsevier Patient Education  2020 [employer]  

## 2020-03-24 NOTE — Progress Notes (Signed)
Subjective:   Patient ID: Lynn Aguirre, female   DOB: 49 y.o.   MRN: [id number]   HPI Patient presents stating with interpreter that she has severe discomfort in both heels that been present for around 4 months.  States it is very sore and makes it hard for her to gait and to walk comfortably.  Patient has good digital perfusion well oriented x3 and she does not smoke   Review of Systems  All other systems reviewed and are negative.       Objective:  Physical Exam Vitals and nursing note reviewed.  Constitutional:      Appearance: She is well-developed and well-nourished.  Cardiovascular:     Pulses: Intact distal pulses.  Pulmonary:     Effort: Pulmonary effort is normal.  Musculoskeletal:        General: Normal range of motion.  Skin:    General: Skin is warm.  Neurological:     Mental Status: She is alert.     Neurovascular status intact muscle strength adequate range of motion adequate patient found to have significant discomfort plantar heel region bilateral at the insertional point of the tendon into the calcaneus with fluid buildup around this area.  Patient is found to have good digital perfusion well oriented x3     Assessment:  Acute plantar fasciitis bilateral inflammation fluid around the medial band with interpreter educating patient on condition     Plan:  H&P reviewed condition sterile prep done and injected the fascia bilateral 3 mg Kenalog 5 mg Xylocaine at insertion applied fascial brace bilateral gave instructions on physical therapy anti-inflammatories diclofenac 75 mg twice daily and shoe gear modification.  This was all done in Spanish reappoint to recheck 2 weeks  X-rays indicate large spur formation bilateral no indication stress fracture arthritis

## 2020-04-11 ENCOUNTER — Ambulatory Visit (INDEPENDENT_AMBULATORY_CARE_PROVIDER_SITE_OTHER): Payer: No Typology Code available for payment source | Admitting: Podiatry

## 2020-04-11 ENCOUNTER — Encounter: Payer: Self-pay | Admitting: Podiatry

## 2020-04-11 ENCOUNTER — Other Ambulatory Visit: Payer: Self-pay

## 2020-04-11 DIAGNOSIS — M722 Plantar fascial fibromatosis: Secondary | ICD-10-CM

## 2020-04-11 MED ORDER — TRIAMCINOLONE ACETONIDE 10 MG/ML IJ SUSP: 10.0000 mg | Freq: Once | INTRAMUSCULAR | Status: AC

## 2020-04-11 MED ORDER — TRIAMCINOLONE ACETONIDE 10 MG/ML IJ SUSP
10.0000 mg | Freq: Once | INTRAMUSCULAR | Status: AC
Start: 2020-04-11 — End: 2020-04-11
  Administered 2020-04-11: 16:00:00 10 mg

## 2020-04-13 NOTE — Progress Notes (Signed)
Subjective:   Patient ID: Lynn Aguirre, female   DOB: 49 y.o.   MRN: [id number]   HPI Patient states the right heel is doing very well and she has a lot of pain in the plantar aspect of the left heel at the insertional point calcaneus   ROS      Objective:  Physical Exam  Neuro vascular status intact with exquisite discomfort plantar aspect left heel at the insertional point of the tendon into the calcaneus with fluid buildup around the medial band at its insertion     Assessment:  Acute plantar fasciitis left with inflammation     Plan:  H&P discussed condition and due to the importance of stretch I did apply night splint with all instructions on usage and then did sterile prep and injected the plantar fascial left 3 mg Dexasone Kenalog 5 mg Xylocaine and advised on over-the-counter insoles with consideration for custom devices if symptoms persist

## 2020-05-30 ENCOUNTER — Ambulatory Visit (INDEPENDENT_AMBULATORY_CARE_PROVIDER_SITE_OTHER): Payer: No Typology Code available for payment source | Admitting: Allergy

## 2020-05-30 ENCOUNTER — Encounter: Payer: Self-pay | Admitting: Allergy

## 2020-05-30 ENCOUNTER — Other Ambulatory Visit: Payer: Self-pay

## 2020-05-30 VITALS — BP 124/80 | HR 74 | Temp 96.1°F | Wt 209.8 lb

## 2020-05-30 DIAGNOSIS — H938X3 Other specified disorders of ear, bilateral: Secondary | ICD-10-CM

## 2020-05-30 DIAGNOSIS — J3089 Other allergic rhinitis: Secondary | ICD-10-CM | POA: Diagnosis not present

## 2020-05-30 MED ORDER — MOMETASONE FUROATE 50 MCG/ACT NA SUSP: 1.0000 | Freq: Two times a day (BID) | NASAL | 11 refills | Status: DC

## 2020-05-30 MED ORDER — LEVOCETIRIZINE DIHYDROCHLORIDE 5 MG PO TABS: 5.0000 mg | ORAL_TABLET | Freq: Every evening | ORAL | 11 refills | Status: DC

## 2020-05-30 NOTE — Patient Instructions (Addendum)
Environmental allergies  2021 skin testing showed: Positive to perennial mold.  Continue environmental control measures as below.  May use over the counter antihistamines such as Xyzal (levocetirizine) daily as needed.  May use Nasonex (mometasone) nasal spray 1 spray per nostril twice a day as needed for nasal congestion.   If you have nosebleeds then take a break for a few days.   Nasal saline spray (i.e., Simply Saline) or nasal saline lavage (i.e., NeilMed) is recommended as needed and prior to medicated nasal sprays.  Ears:  Ears look normal on exam today.  If you have persistent problems, please follow up with ENT.  Follow up as needed.   Mold Control . Mold and fungi can grow on a variety of surfaces provided certain temperature and moisture conditions exist.  . Outdoor molds grow on plants, decaying vegetation and soil. The major outdoor mold, Alternaria and Cladosporium, are found in very high numbers during hot and dry conditions. Generally, a late summer - fall peak is seen for common outdoor fungal spores. Rain will temporarily lower outdoor mold spore count, but counts rise rapidly when the rainy period ends. . The most important indoor molds are Aspergillus and Penicillium. Dark, humid and poorly ventilated basements are ideal sites for mold growth. The next most common sites of mold growth are the bathroom and the kitchen. Outdoor (Seasonal) Mold Control . Use air conditioning and keep windows closed. . Avoid exposure to decaying vegetation. Marland Kitchen Avoid leaf raking. . Avoid grain handling. . Consider wearing a face mask if working in moldy areas.  Indoor (Perennial) Mold Control  . Maintain humidity below 50%. . Get rid of mold growth on hard surfaces with water, detergent and, if necessary, 5% bleach (do not mix with other cleaners). Then dry the area completely. If mold covers an area more than 10 square feet, consider hiring an indoor environmental professional. . For  clothing, washing with soap and water is best. If moldy items cannot be cleaned and dried, throw them away. . Remove sources e.g. contaminated carpets. . Repair and seal leaking roofs or pipes. Using dehumidifiers in damp basements may be helpful, but empty the water and clean units regularly to prevent mildew from forming. All rooms, especially basements, bathrooms and kitchens, require ventilation and cleaning to deter mold and mildew growth. Avoid carpeting on concrete or damp floors, and storing items in damp areas.

## 2020-05-30 NOTE — Assessment & Plan Note (Signed)
Past history - perennial rhinitis symptoms with main complaint of ear pain/pressure for 3 to 4 years.  Tried Benadryl, Nasacort and prednisone with some benefit.  Patient was on allergy injections for about 1 year in [place] in her 78s with good benefit.  Recent ENT evaluation with CT sinus was unremarkable.  2021 intradermal skin testing showed: Positive to perennial molds only - denies having any mold/water damage issues at her home. Interim history - improved with below regimen.  Continue environmental control measures as below.  May use over the counter antihistamines such as Xyzal (levocetirizine) 5mg  daily as needed.  May use Nasonex (mometasone) nasal spray 1 spray per nostril twice a day as needed for nasal congestion.   Stop for a few days if having epistaxis.   Nasal saline spray (i.e., Simply Saline) or nasal saline lavage (i.e., NeilMed) is recommended as needed and prior to medicated nasal sprays.

## 2020-05-30 NOTE — Assessment & Plan Note (Signed)
Past history - Ear fullness/pressure/pain and dizziness. Denies hearing loss.  Interim history - symptoms 90% improved.  Ears look normal on exam today.  If you have persistent problems, please follow up with ENT.

## 2020-05-30 NOTE — Progress Notes (Signed)
Follow Up Note  RE: Lynn Aguirre MRN: [id number] DOB: 12-Jul-1970 Date of Office Visit: 05/30/2020  Referring provider: Georgina Quint, * Primary care provider: Georgina Quint, MD  Chief Complaint: Allergic Rhinitis  (Zyzal has helped with allergy symptoms and is here for a refill )  History of Present Illness: I had the pleasure of seeing Lynn Aguirre for a follow up visit at the [facility] on 05/30/2020. She is a 50 y.o. female, who is being followed for allergic rhinitis. Her previous allergy office visit was on 02/24/2020 with Dr. Selena Batten. Today is a regular follow up visit. Spanish translator used via Photographer.   Allergic rhinitis  Currently using Xyzal daily at night with good benefit. Using Nasonex 2 sprays per nostril at night. No nosebleeds. Noticed improvement in symptoms.  Sensation of fullness in both ears The ear fullness is about 90% improved.   Assessment and Plan: Trenese is a 50 y.o. female with: Other allergic rhinitis Past history - perennial rhinitis symptoms with main complaint of ear pain/pressure for 3 to 4 years.  Tried Benadryl, Nasacort and prednisone with some benefit.  Patient was on allergy injections for about 1 year in [place] in her 83s with good benefit.  Recent ENT evaluation with CT sinus was unremarkable.  2021 intradermal skin testing showed: Positive to perennial molds only - denies having any mold/water damage issues at her home. Interim history - improved with below regimen.  Continue environmental control measures as below.  May use over the counter antihistamines such as Xyzal (levocetirizine) 5mg  daily as needed.  May use Nasonex (mometasone) nasal spray 1 spray per nostril twice a day as needed for nasal congestion.   Stop for a few days if having epistaxis.   Nasal saline spray (i.e., Simply Saline) or nasal saline lavage (i.e., NeilMed) is recommended as needed and prior to medicated  nasal sprays.  Sensation of fullness in both ears Past history - Ear fullness/pressure/pain and dizziness. Denies hearing loss.  Interim history - symptoms 90% improved.  Ears look normal on exam today.  If you have persistent problems, please follow up with ENT.  Return if symptoms worsen or fail to improve.  Meds ordered this encounter  Medications  . levocetirizine (XYZAL) 5 MG tablet    Sig: Take 1 tablet (5 mg total) by mouth every evening.    Dispense:  30 tablet    Refill:  11  . mometasone (NASONEX) 50 MCG/ACT nasal spray    Sig: Place 1 spray into the nose in the morning and at bedtime. For nasal congestion.    Dispense:  1 each    Refill:  11   Diagnostics: None.  Medication List:  Current Outpatient Medications  Medication Sig Dispense Refill  . acetic acid-hydrocortisone (VOSOL-HC) OTIC solution Place 3 drops into the left ear 3 (three) times daily. 10 mL 1  . diclofenac (VOLTAREN) 75 MG EC tablet Take 1 tablet (75 mg total) by mouth 2 (two) times daily. 30 tablet 0  . loratadine (CLARITIN) 10 MG tablet Take 10 mg by mouth daily as needed for allergies. Reported on 09/19/2015    . methylPREDNISolone (MEDROL DOSEPAK) 4 MG TBPK tablet Sig as indicated 21 tablet 1  . omeprazole (PRILOSEC) 40 MG capsule Take 1 capsule (40 mg total) by mouth daily. 30 capsule 3  . simethicone (MYLICON) 125 MG chewable tablet Chew 125 mg by mouth every 6 (six) hours as needed for flatulence.    11/19/2015  sodium chloride (OCEAN) 0.65 % SOLN nasal spray Place 1 spray into both nostrils as needed for congestion. 30 mL 1  . triamcinolone (NASACORT) 55 MCG/ACT AERO nasal inhaler Place 2 sprays into the nose daily. 1 Inhaler 12  . levocetirizine (XYZAL) 5 MG tablet Take 1 tablet (5 mg total) by mouth every evening. 30 tablet 11  . mometasone (NASONEX) 50 MCG/ACT nasal spray Place 1 spray into the nose in the morning and at bedtime. For nasal congestion. 1 each 11   Current Facility-Administered  Medications  Medication Dose Route Frequency Provider Last Rate Last Admin  . triamcinolone acetonide (KENALOG) 10 MG/ML injection 10 mg  10 mg Other Once Regal, Norman S, DPM       Allergies: No Known Allergies I reviewed her past medical history, social history, family history, and environmental history and no significant changes have been reported from her previous visit.  Review of Systems  Constitutional: Negative for appetite change, chills, fever and unexpected weight change.  HENT: Negative for congestion, rhinorrhea and sinus pressure.   Eyes: Negative for itching.  Respiratory: Negative for cough, chest tightness, shortness of breath and wheezing.   Cardiovascular: Negative for chest pain.  Gastrointestinal: Negative for abdominal pain.  Genitourinary: Negative for difficulty urinating.  Skin: Negative for rash.  Allergic/Immunologic: Positive for environmental allergies.  Neurological: Negative for headaches.   Objective: BP 124/80   Pulse 74   Temp (!) 96.1 F (35.6 C)   Wt 209 lb 12.8 oz (95.2 kg)   SpO2 98%   BMI 36.01 kg/m  Body mass index is 36.01 kg/m. Physical Exam Vitals and nursing note reviewed.  Constitutional:      Appearance: Normal appearance. She is well-developed.  HENT:     Head: Normocephalic and atraumatic.     Right Ear: External ear normal.     Left Ear: External ear normal.     Nose: Nose normal.     Mouth/Throat:     Mouth: Mucous membranes are moist.     Pharynx: Oropharynx is clear.  Eyes:     Conjunctiva/sclera: Conjunctivae normal.  Cardiovascular:     Rate and Rhythm: Normal rate and regular rhythm.     Heart sounds: Normal heart sounds. No murmur heard. No friction rub. No gallop.   Pulmonary:     Effort: Pulmonary effort is normal.     Breath sounds: Normal breath sounds. No wheezing, rhonchi or rales.  Abdominal:     Palpations: Abdomen is soft.  Musculoskeletal:     Cervical back: Neck supple.  Skin:    General: Skin  is warm.     Findings: No rash.  Neurological:     Mental Status: She is alert and oriented to person, place, and time.  Psychiatric:        Behavior: Behavior normal.    Previous notes and tests were reviewed. The plan was reviewed with the patient/family, and all questions/concerned were addressed.  It was my pleasure to see Lynn Aguirre today and participate in her care. Please feel free to contact me with any questions or concerns.  Sincerely,  Wyline Mood, DO Allergy & Immunology  [facility] office: [phone] [facility] office: [phone]

## 2021-01-19 ENCOUNTER — Ambulatory Visit: Payer: No Typology Code available for payment source | Admitting: Emergency Medicine

## 2021-01-19 ENCOUNTER — Other Ambulatory Visit: Payer: Self-pay | Admitting: Emergency Medicine

## 2021-01-19 DIAGNOSIS — Z1231 Encounter for screening mammogram for malignant neoplasm of breast: Secondary | ICD-10-CM

## 2021-01-25 ENCOUNTER — Ambulatory Visit (INDEPENDENT_AMBULATORY_CARE_PROVIDER_SITE_OTHER): Payer: No Typology Code available for payment source | Admitting: Podiatry

## 2021-01-25 ENCOUNTER — Other Ambulatory Visit: Payer: Self-pay

## 2021-01-25 ENCOUNTER — Encounter: Payer: Self-pay | Admitting: Podiatry

## 2021-01-25 DIAGNOSIS — M722 Plantar fascial fibromatosis: Secondary | ICD-10-CM | POA: Diagnosis not present

## 2021-01-25 MED ORDER — DICLOFENAC SODIUM 75 MG PO TBEC
75.0000 mg | DELAYED_RELEASE_TABLET | Freq: Two times a day (BID) | ORAL | 2 refills | Status: AC
Start: 2021-01-25 — End: ?

## 2021-01-25 MED ORDER — TRIAMCINOLONE ACETONIDE 10 MG/ML IJ SUSP
20.0000 mg | Freq: Once | INTRAMUSCULAR | Status: AC
  Administered 2021-01-25: 20 mg

## 2021-01-26 NOTE — Progress Notes (Signed)
Subjective:   Patient ID: Lynn Aguirre, female   DOB: 50 y.o.   MRN: [id number]   HPI Patient presents with a continuation of discomfort in the plantar aspect of both heels.  States she had a period of relief approximate 3 to 4 months with gradual reoccurrence and states that her boot she can only wear at 1 foot and that seems to help her when she sleeps in it.   ROS      Objective:  Physical Exam  Neuro vascular status intact exquisite discomfort plantar aspect heel region bilateral inflammation fluid left over right     Assessment:  Acute plantar fasciitis bilateral heel     Plan:  H&P reviewed condition sterile prep and injected the plantar fascial bilateral 3 mg Kenalog 5 mg Xylocaine and dispensed a night splint so she can lift both heels.  Discussed the continuation of treatment possibility for surgery and I want to see how much time were able to get actual relief and we will reevaluate her in 2 months or earlier if the symptoms were to get worse

## 2021-02-17 ENCOUNTER — Ambulatory Visit
Admission: RE | Admit: 2021-02-17 | Discharge: 2021-02-17 | Disposition: A | Discharge status: Home / Self Care | Payer: PRIVATE HEALTH INSURANCE | Source: Ambulatory Visit | Attending: Emergency Medicine | Admitting: Emergency Medicine

## 2021-02-17 ENCOUNTER — Other Ambulatory Visit: Payer: Self-pay

## 2021-02-17 DIAGNOSIS — Z1231 Encounter for screening mammogram for malignant neoplasm of breast: Secondary | ICD-10-CM

## 2021-03-19 ENCOUNTER — Encounter: Payer: Self-pay | Admitting: Emergency Medicine

## 2021-03-21 NOTE — Telephone Encounter (Signed)
Patient has OV for 03/27/21 to f/u from UC, heart murmur.

## 2021-03-22 ENCOUNTER — Ambulatory Visit: Payer: No Typology Code available for payment source | Admitting: Podiatry

## 2021-03-27 ENCOUNTER — Ambulatory Visit (INDEPENDENT_AMBULATORY_CARE_PROVIDER_SITE_OTHER): Payer: PRIVATE HEALTH INSURANCE | Admitting: Emergency Medicine

## 2021-03-27 ENCOUNTER — Encounter: Payer: Self-pay | Admitting: Emergency Medicine

## 2021-03-27 ENCOUNTER — Other Ambulatory Visit: Payer: Self-pay

## 2021-03-27 VITALS — BP 124/86 | HR 86 | Temp 98.0°F | Ht 64.0 in | Wt 203.0 lb

## 2021-03-27 DIAGNOSIS — B349 Viral infection, unspecified: Secondary | ICD-10-CM | POA: Diagnosis not present

## 2021-03-27 DIAGNOSIS — Z124 Encounter for screening for malignant neoplasm of cervix: Secondary | ICD-10-CM | POA: Diagnosis not present

## 2021-03-27 NOTE — Patient Instructions (Signed)

## 2021-03-27 NOTE — Progress Notes (Signed)
Lynn Aguirre 50 y.o.   Chief Complaint  Patient presents with   Sore Throat    Pt was seen at [facility] Saturday,pt given antibiotic.   Heart Problem    Pt states that the UC wanted pt to f/u with pcp about heart murmur.    HISTORY OF PRESENT ILLNESS: This is a 50 y.o. female recently seen at urgent care center for strep sore throat and advised to follow-up with PCP for possible heart murmur.  Discharge instructions mention split S1 but no mention of murmur on physical exam. Patient was treated with azithromycin for 5 days.  Much better.  Today she is asymptomatic.  Sore Throat  Pertinent negatives include no abdominal pain, congestion, coughing, headaches, shortness of breath or vomiting.  Heart Problem Pertinent negatives include no abdominal pain, chest pain, chills, congestion, coughing, fever, headaches, nausea, rash, sore throat or vomiting.    Prior to Admission medications   Medication Sig Start Date End Date Taking? Authorizing Provider  acetic acid-hydrocortisone (VOSOL-HC) OTIC solution Place 3 drops into the left ear 3 (three) times daily. 06/15/19  Yes Isak Sotomayor, Eilleen Kempf, MD  diclofenac (VOLTAREN) 75 MG EC tablet Take 1 tablet (75 mg total) by mouth 2 (two) times daily. 01/25/21  Yes Regal, Kirstie Peri, DPM  levocetirizine (XYZAL) 5 MG tablet Take 1 tablet (5 mg total) by mouth every evening. 05/30/20  Yes Ellamae Sia, DO  loratadine (CLARITIN) 10 MG tablet Take 10 mg by mouth daily as needed for allergies. Reported on 09/19/2015   Yes [provider]  mometasone (NASONEX) 50 MCG/ACT nasal spray Place 1 spray into the nose in the morning and at bedtime. For nasal congestion. 05/30/20  Yes Ellamae Sia, DO  omeprazole (PRILOSEC) 40 MG capsule Take 1 capsule (40 mg total) by mouth daily. 12/31/16  Yes English, Judeth Cornfield D, PA  simethicone (MYLICON) 125 MG chewable tablet Chew 125 mg by mouth every 6 (six) hours as needed for flatulence.   Yes [provider]  sodium  chloride (OCEAN) 0.65 % SOLN nasal spray Place 1 spray into both nostrils as needed for congestion. 06/08/19  Yes Kelsi Benham, Eilleen Kempf, MD  triamcinolone (NASACORT) 55 MCG/ACT AERO nasal inhaler Place 2 sprays into the nose daily. 07/08/19  Yes Georgina Quint, MD  methylPREDNISolone (MEDROL DOSEPAK) 4 MG TBPK tablet Sig as indicated Patient not taking: Reported on 03/27/2021 07/08/19   Georgina Quint, MD    No Known Allergies  Patient Active Problem List   Diagnosis Date Noted   Other allergic rhinitis 02/24/2020   Sensation of fullness in both ears 02/24/2020   Gastroesophageal reflux disease 01/29/2017   Seasonal allergies 01/29/2017    Past Medical History:  Diagnosis Date   Allergy     Past Surgical History:  Procedure Laterality Date   CESAREAN SECTION  2002   CESAREAN SECTION      Social History   Socioeconomic History   Marital status: Married    Spouse name: Not on file   Number of children: 4   Years of education: Not on file   Highest education level: 9th grade  Occupational History   Not on file  Tobacco Use   Smoking status: Never   Smokeless tobacco: Never  Vaping Use   Vaping Use: Never used  Substance and Sexual Activity   Alcohol use: No   Drug use: No   Sexual activity: Yes    Birth control/protection: None, Condom  Other Topics Concern  Not on file  Social History Narrative   Not on file   Social Determinants of Health   Financial Resource Strain: Not on file  Food Insecurity: Not on file  Transportation Needs: Not on file  Physical Activity: Not on file  Stress: Not on file  Social Connections: Not on file  Intimate Partner Violence: Not on file    Family History  Problem Relation Age of Onset   Diabetes Father      Review of Systems  Constitutional: Negative.  Negative for chills and fever.  HENT: Negative.  Negative for congestion and sore throat.   Respiratory: Negative.  Negative for cough and shortness of  breath.   Cardiovascular: Negative.  Negative for chest pain and palpitations.  Gastrointestinal: Negative.  Negative for abdominal pain, nausea and vomiting.  Genitourinary: Negative.  Negative for dysuria.  Skin: Negative.  Negative for rash.  Neurological: Negative.  Negative for dizziness and headaches.  All other systems reviewed and are negative.  Today's Vitals   03/27/21 1501  BP: 124/86  Pulse: 86  Temp: 98 F (36.7 C)  TempSrc: Oral  SpO2: 98%  Weight: 203 lb (92.1 kg)  Height: 5\' 4"  (1.626 m)   Body mass index is 34.84 kg/m.  Physical Exam Vitals reviewed.  Constitutional:      Appearance: Normal appearance. She is well-developed. She is obese.  HENT:     Head: Normocephalic.     Right Ear: Tympanic membrane, ear canal and external ear normal.     Left Ear: Tympanic membrane, ear canal and external ear normal.     Mouth/Throat:     Mouth: Mucous membranes are moist.     Pharynx: Oropharynx is clear. No oropharyngeal exudate or posterior oropharyngeal erythema.  Eyes:     Extraocular Movements: Extraocular movements intact.     Conjunctiva/sclera: Conjunctivae normal.     Pupils: Pupils are equal, round, and reactive to light.  Cardiovascular:     Rate and Rhythm: Normal rate and regular rhythm.     Pulses: Normal pulses.     Heart sounds: Normal heart sounds. No murmur heard. Pulmonary:     Effort: Pulmonary effort is normal.     Breath sounds: Normal breath sounds.  Musculoskeletal:        General: Normal range of motion.     Cervical back: Normal range of motion and neck supple. No tenderness.     Right lower leg: No edema.     Left lower leg: No edema.  Lymphadenopathy:     Cervical: No cervical adenopathy.  Skin:    General: Skin is warm and dry.     Capillary Refill: Capillary refill takes less than 2 seconds.  Neurological:     General: No focal deficit present.     Mental Status: She is alert and oriented to person, place, and time.   Psychiatric:        Mood and Affect: Mood normal.        Behavior: Behavior normal.     ASSESSMENT & PLAN: Problem List Items Addressed This Visit   None Visit Diagnoses     Viral illness    -  Primary   Much improved.      Clinically stable.  No heart murmur detected on physical exam. Much improved infectious process. No need for any additional treatment today.  Patient Instructions  Mantenimiento de en las mujeres Health Maintenance, Female Adoptar un estilo de vida saludable y recibir atencin  preventiva son importantes para promover la salud y Counsellor. Consulte al mdico sobre: El esquema adecuado para hacerse pruebas y exmenes peridicos. Cosas que puede hacer por su cuenta para prevenir enfermedades y [place] sano. Qu debo saber sobre la dieta, el peso y el ejercicio? Consuma una dieta saludable  Consuma una dieta que incluya muchas verduras, frutas, productos lcteos con bajo contenido de [place] y Associate Professor. No consuma muchos alimentos ricos en grasas slidas, azcares agregados o sodio. Mantenga un peso saludable El ndice de masa muscular [facility]) se [place] para identificar problemas de [place]. Proporciona una estimacin de la grasa corporal basndose en el peso y la altura. Su mdico puede ayudarle a Engineer, site I[facility] y a Personnel officer o Pharmacologist un peso saludable. Haga ejercicio con regularidad Haga ejercicio con regularidad. Esta es una de las prcticas ms importantes que puede hacer por su salud. La [employer] de los adultos deben seguir estas pautas: Education officer, environmental, al menos, 150 minutos de actividad fsica por semana. El ejercicio debe aumentar la frecuencia cardaca y Media planner transpirar (ejercicio de intensidad moderada). Hacer ejercicios de fortalecimiento por lo [employer] por semana. Agregue esto a su plan de ejercicio de intensidad moderada. Pase menos tiempo sentada. Incluso la actividad fsica ligera puede ser beneficiosa. Controle sus niveles  de colesterol y lpidos en la sangre Comience a realizarse anlisis de lpidos y Oncologist en la sangre a los 20 aos y luego reptalos cada 5 aos. Hgase controlar los niveles de colesterol con mayor frecuencia si: Sus niveles de lpidos y colesterol son altos. Es mayor de 40 aos. Presenta un alto riesgo de padecer enfermedades cardacas. Qu debo saber sobre las pruebas de deteccin del cncer? Segn su historia clnica y sus antecedentes familiares, es posible que deba realizarse pruebas de deteccin del cncer en diferentes edades. Esto puede incluir pruebas de deteccin de lo siguiente: Cncer de mama. Cncer de cuello uterino. Cncer colorrectal. Cncer de piel. Cncer de pulmn. Qu debo saber sobre la enfermedad cardaca, la diabetes y la hipertensin arterial? Presin arterial y enfermedad cardaca La hipertensin arterial causa enfermedades cardacas y [place] el riesgo de accidente cerebrovascular. Es ms probable que esto se manifieste en las personas que tienen lecturas de presin arterial alta o tienen sobrepeso. Hgase controlar la presin arterial: Cada 3 a 5 aos si tiene entre 18 y 48 aos. Todos los aos si es mayor de 40 aos. Diabetes Realcese exmenes de deteccin de la diabetes con regularidad. Este anlisis revisa el nivel de azcar en la sangre en [place]. Hgase las pruebas de deteccin: Cada tres aos despus de los 40 aos de edad si tiene un peso normal y un bajo riesgo de padecer diabetes. Con ms frecuencia y a partir de [place] edad inferior si tiene sobrepeso o un alto riesgo de padecer diabetes. Qu debo saber sobre la prevencin de infecciones? Hepatitis B Si tiene un riesgo ms alto de contraer hepatitis B, debe someterse a un examen de deteccin de este virus. Hable con el mdico para averiguar si tiene riesgo de contraer la infeccin por hepatitis B. Hepatitis C Se recomienda el anlisis a: [employer] 1945 y 1965. Todas las  personas que tengan un riesgo de haber contrado hepatitis C. Enfermedades de transmisin sexual (ETS) Hgase las pruebas de Airline pilot de ITS, incluidas la gonorrea y la clamidia, si: Es sexualmente activa y es menor de [address]. Es mayor de [address], y Public affairs consultant informa que corre riesgo de Warehouse manager este tipo  de infecciones. La actividad sexual ha cambiado desde que le hicieron la ltima prueba de deteccin y tiene un riesgo mayor de Warehouse manager clamidia o Copy. Pregntele al mdico si usted tiene riesgo. Pregntele al mdico si usted tiene un alto riesgo de Primary school teacher VIH. El mdico tambin puede recomendarle un medicamento recetado para ayudar a evitar la infeccin por el VIH. Si elige tomar medicamentos para prevenir el VIH, primero debe [employer] de deteccin del VIH. Luego debe hacerse anlisis cada 3 meses mientras est tomando los medicamentos. Embarazo Si est por dejar de Armed forces training and education officer (fase premenopusica) y usted puede quedar [place], busque asesoramiento antes de [place]. [place] de 400 a 800 microgramos (mcg) de cido [employer] si [place]. Pida mtodos de control de la natalidad (anticonceptivos) si desea evitar un embarazo no deseado. Osteoporosis y [place] La osteoporosis es una enfermedad en la que los huesos pierden los minerales y la fuerza por el avance de la edad. El resultado pueden ser fracturas en los [place]. Si tiene 65 aos o ms, o si est en riesgo de sufrir osteoporosis y fracturas, pregunte a su mdico si debe: Hacerse pruebas de deteccin de prdida sea. Tomar un suplemento de calcio o de vitamina D para reducir el riesgo de fracturas. Recibir terapia de reemplazo hormonal (TRH) para tratar los sntomas de la menopausia. Siga estas indicaciones en su casa: Consumo de alcohol No beba alcohol si: Su mdico le indica no hacerlo. Est embarazada, puede estar embarazada o est tratando de [place]. Si bebe alcohol: Limite la  cantidad que bebe a lo siguiente: De 0 a 1 bebida por da. Sepa cunta cantidad de alcohol hay en las bebidas que toma. En los [address], una medida equivale a una botella de cerveza de 12 oz (355 ml), un vaso de vino de 5 oz (148 ml) o un vaso de una bebida alcohlica de alta graduacin de 1 oz (44 ml). Estilo de vida No consuma ningn producto que contenga nicotina o tabaco. Estos productos incluyen cigarrillos, tabaco para Theatre manager y aparatos de vapeo, como los Administrator, Civil Service. Si necesita ayuda para dejar de consumir estos productos, consulte al mdico. No consuma drogas. No comparta agujas. Solicite ayuda a su mdico si necesita apoyo o informacin para abandonar las drogas. Indicaciones generales Realcese los estudios de rutina de [address], dentales y de Wellsite geologist. Mantngase al da con las vacunas. Infrmele a su mdico si: Se siente deprimida con frecuencia. Alguna vez ha sido vctima de [place] o no se siente seguro en su casa. Resumen Adoptar un estilo de vida saludable y recibir atencin preventiva son importantes para promover la salud y Counsellor. Siga las instrucciones del mdico acerca de una dieta saludable, el ejercicio y la realizacin de pruebas o exmenes para Hotel manager. Siga las instrucciones del mdico con respecto al control del colesterol y la presin arterial. Esta informacin no tiene Theme park manager el consejo del mdico. Asegrese de hacerle al mdico cualquier pregunta que tenga. Document Revised: 09/08/2020 Document Reviewed: 09/08/2020 Elsevier Patient Education  2022 Elsevier Inc.    Edwina Barth, MD [facility]

## 2021-03-31 ENCOUNTER — Other Ambulatory Visit: Payer: Self-pay

## 2021-03-31 ENCOUNTER — Encounter: Payer: Self-pay | Admitting: Podiatry

## 2021-03-31 ENCOUNTER — Ambulatory Visit (INDEPENDENT_AMBULATORY_CARE_PROVIDER_SITE_OTHER): Payer: No Typology Code available for payment source | Admitting: Podiatry

## 2021-03-31 DIAGNOSIS — M722 Plantar fascial fibromatosis: Secondary | ICD-10-CM | POA: Diagnosis not present

## 2021-04-01 NOTE — Progress Notes (Signed)
Subjective:   Patient ID: Lynn Aguirre, female   DOB: 50 y.o.   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  Patient wants to have these made I educated her on this and she is casted today for functional orthotic devices

## 2021-04-26 ENCOUNTER — Telehealth: Payer: Self-pay | Admitting: Podiatry

## 2021-04-26 NOTE — Telephone Encounter (Signed)
Milly left message for pt to call to schedule an appt to pick up orthotics as they are in.

## 2021-05-09 ENCOUNTER — Ambulatory Visit (INDEPENDENT_AMBULATORY_CARE_PROVIDER_SITE_OTHER): Payer: Commercial Managed Care - PPO | Admitting: Obstetrics

## 2021-05-09 ENCOUNTER — Other Ambulatory Visit: Payer: Self-pay

## 2021-05-09 ENCOUNTER — Encounter: Payer: Self-pay | Admitting: Obstetrics

## 2021-05-09 ENCOUNTER — Other Ambulatory Visit (HOSPITAL_COMMUNITY)
Admission: RE | Admit: 2021-05-09 | Discharge: 2021-05-09 | Disposition: A | Discharge status: Home / Self Care | Payer: Commercial Managed Care - PPO | Source: Ambulatory Visit | Attending: Obstetrics | Admitting: Obstetrics

## 2021-05-09 VITALS — BP 135/82 | HR 77 | Ht 60.0 in | Wt 210.0 lb

## 2021-05-09 DIAGNOSIS — Z6841 Body Mass Index (BMI) 40.0 and over, adult: Secondary | ICD-10-CM | POA: Diagnosis not present

## 2021-05-09 DIAGNOSIS — Z01419 Encounter for gynecological examination (general) (routine) without abnormal findings: Secondary | ICD-10-CM | POA: Diagnosis present

## 2021-05-09 DIAGNOSIS — E66813 Obesity, class 3: Secondary | ICD-10-CM

## 2021-05-09 NOTE — Progress Notes (Signed)
Subjective:        Lynn Aguirre is a 51 y.o. female here for a routine exam.  Current complaints: None.    Personal health questionnaire:  Is patient Ashkenazi Jewish, have a family history of breast and/or ovarian cancer: no Is there a family history of uterine cancer diagnosed at age < 68, gastrointestinal cancer, urinary tract cancer, family member who is a Personnel officer syndrome-associated carrier: no Is the patient overweight and hypertensive, family history of diabetes, personal history of gestational diabetes, preeclampsia or PCOS: no Is patient over 81, have PCOS,  family history of premature CHD under age 52, diabetes, smoke, have hypertension or peripheral artery disease:  no At any time, has a partner hit, kicked or otherwise hurt or frightened you?: no Over the past 2 weeks, have you felt down, depressed or hopeless?: no Over the past 2 weeks, have you felt little interest or pleasure in doing things?:no   Gynecologic History Patient's last menstrual period was 07/28/2018 (approximate). Contraception: post menopausal status Last Pap: ~ 3 years ago. Results were: normal Last mammogram: 11/ 2022. Results were: normal  Obstetric History OB History  Gravida Para Term Preterm AB Living  4 4 4     4   SAB IAB Ectopic Multiple Live Births          4    # Outcome Date GA Lbr Len/2nd Weight Sex Delivery Anes PTL Lv  4 Term           3 Term           2 Term           1 Term             Past Medical History:  Diagnosis Date   Allergy     Past Surgical History:  Procedure Laterality Date   CESAREAN SECTION  2002   CESAREAN SECTION       Current Outpatient Medications:    acetic acid-hydrocortisone (VOSOL-HC) OTIC solution, Place 3 drops into the left ear 3 (three) times daily., Disp: 10 mL, Rfl: 1   diclofenac (VOLTAREN) 75 MG EC tablet, Take 1 tablet (75 mg total) by mouth 2 (two) times daily. (Patient not taking: Reported on 05/09/2021), Disp: 50 tablet, Rfl: 2    levocetirizine (XYZAL) 5 MG tablet, Take 1 tablet (5 mg total) by mouth every evening., Disp: 30 tablet, Rfl: 11   loratadine (CLARITIN) 10 MG tablet, Take 10 mg by mouth daily as needed for allergies. Reported on 09/19/2015 (Patient not taking: Reported on 05/09/2021), Disp: , Rfl:    methylPREDNISolone (MEDROL DOSEPAK) 4 MG TBPK tablet, Sig as indicated (Patient not taking: Reported on 03/27/2021), Disp: 21 tablet, Rfl: 1   mometasone (NASONEX) 50 MCG/ACT nasal spray, Place 1 spray into the nose in the morning and at bedtime. For nasal congestion. (Patient not taking: Reported on 05/09/2021), Disp: 1 each, Rfl: 11   omeprazole (PRILOSEC) 40 MG capsule, Take 1 capsule (40 mg total) by mouth daily. (Patient not taking: Reported on 05/09/2021), Disp: 30 capsule, Rfl: 3   simethicone (MYLICON) 125 MG chewable tablet, Chew 125 mg by mouth every 6 (six) hours as needed for flatulence. (Patient not taking: Reported on 05/09/2021), Disp: , Rfl:    sodium chloride (OCEAN) 0.65 % SOLN nasal spray, Place 1 spray into both nostrils as needed for congestion. (Patient not taking: Reported on 05/09/2021), Disp: 30 mL, Rfl: 1   triamcinolone (NASACORT) 55 MCG/ACT AERO nasal inhaler, Place 2  sprays into the nose daily. (Patient not taking: Reported on 05/09/2021), Disp: 1 Inhaler, Rfl: 12  Current Facility-Administered Medications:    triamcinolone acetonide (KENALOG) 10 MG/ML injection 10 mg, 10 mg, Other, Once, Regal, Kirstie Peri, DPM No Known Allergies  Social History   Tobacco Use   Smoking status: Never   Smokeless tobacco: Never  Substance Use Topics   Alcohol use: No    Family History  Problem Relation Age of Onset   Diabetes Father       Review of Systems  Constitutional: negative for fatigue and weight loss Respiratory: negative for cough and wheezing Cardiovascular: negative for chest pain, fatigue and palpitations Gastrointestinal: negative for abdominal pain and change in bowel  habits Musculoskeletal:negative for myalgias Neurological: negative for gait problems and tremors Behavioral/Psych: negative for abusive relationship, depression Endocrine: negative for temperature intolerance    Genitourinary:negative for abnormal menstrual periods, genital lesions, hot flashes, sexual problems and vaginal discharge Integument/breast: negative for breast lump, breast tenderness, nipple discharge and skin lesion(s)    Objective:       BP 135/82    Pulse 77    Ht 5' (1.524 m)    Wt 210 lb (95.3 kg)    LMP 07/28/2018 (Approximate)    BMI 41.01 kg/m  General:   Alert and no distress  Skin:   no rash or abnormalities  Lungs:   clear to auscultation bilaterally  Heart:   regular rate and rhythm, S1, S2 normal, no murmur, click, rub or gallop  Breasts:   normal without suspicious masses, skin or nipple changes or axillary nodes  Abdomen:  normal findings: no organomegaly, soft, non-tender and no hernia  Pelvis:  External genitalia: normal general appearance Urinary system: urethral meatus normal and bladder without fullness, nontender Vaginal: normal without tenderness, induration or masses Cervix: normal appearance Adnexa: normal bimanual exam Uterus: anteverted and non-tender, normal size   Lab Review Urine pregnancy test Labs reviewed yes Radiologic studies reviewed yes  I have spent a total of 20 minutes of face-to-face and non-face-to-face time, excluding clinical staff time, reviewing notes and preparing to see patient, ordering tests and/or medications, and counseling the patient.   Assessment:    1. Encounter for gynecological examination with Papanicolaou smear of cervix Rx: - Cytology - PAP( [place])  2. Class 3 severe obesity without serious comorbidity with body mass index (BMI) of 40.0 to 44.9 in adult, unspecified obesity type (HCC) - weight reduction with the aid of dietary changes, exercise and behavioral modification recommended     Plan:     Education reviewed: calcium supplements, depression evaluation, low fat, low cholesterol diet, safe sex/STD prevention, self breast exams, and weight bearing exercise. Mammogram ordered.     Brock Bad, MD 05/09/2021 3:29 PM

## 2021-05-09 NOTE — Progress Notes (Addendum)
NEW GYN presents for AEX/PAP Last Mammogram 02/2021

## 2021-05-11 LAB — CYTOLOGY - PAP
Comment: NEGATIVE
Diagnosis: NEGATIVE
High risk HPV: NEGATIVE

## 2021-05-30 ENCOUNTER — Other Ambulatory Visit: Payer: Self-pay

## 2021-05-30 ENCOUNTER — Ambulatory Visit (INDEPENDENT_AMBULATORY_CARE_PROVIDER_SITE_OTHER): Payer: No Typology Code available for payment source

## 2021-05-30 DIAGNOSIS — M722 Plantar fascial fibromatosis: Secondary | ICD-10-CM

## 2021-05-30 NOTE — Progress Notes (Signed)
SITUATION: Reason for Visit: Fitting and Delivery of Custom Fabricated Foot Orthoses Patient Report: Patient reports comfort and is satisfied with device.  OBJECTIVE DATA: Patient History / Diagnosis:     ICD-10-CM   1. Plantar fasciitis, bilateral  M72.2       Provided Device:  Custom Functional Foot Orthotics     [facility] Labs: [id number]  GOAL OF ORTHOSIS - Improve gait - Decrease energy expenditure - Improve Balance - Provide Triplanar stability of foot complex - Facilitate motion  ACTIONS PERFORMED Patient was fit with foot orthotics trimmed to shoe last. Patient tolerated fittign procedure.   Patient was provided with verbal and written instruction and demonstration regarding donning, doffing, wear, care, proper fit, function, purpose, cleaning, and use of the orthosis and in all related precautions and risks and benefits regarding the orthosis.  Patient was also provided with verbal instruction regarding how to report any failures or malfunctions of the orthosis and necessary follow up care. Patient was also instructed to contact our office regarding any change in status that may affect the function of the orthosis.  Patient demonstrated independence with proper donning, doffing, and fit and verbalized understanding of all instructions.  PLAN: Patient is to follow up in one week or as necessary (PRN). All questions were answered and concerns addressed. Plan of care was discussed with and agreed upon by the patient.

## 2021-11-16 ENCOUNTER — Encounter: Payer: Self-pay | Admitting: Emergency Medicine

## 2021-11-16 ENCOUNTER — Ambulatory Visit (INDEPENDENT_AMBULATORY_CARE_PROVIDER_SITE_OTHER): Payer: Commercial Managed Care - PPO | Admitting: Emergency Medicine

## 2021-11-16 VITALS — BP 134/86 | HR 64 | Temp 97.9°F | Ht 61.0 in | Wt 202.0 lb

## 2021-11-16 DIAGNOSIS — H1131 Conjunctival hemorrhage, right eye: Secondary | ICD-10-CM | POA: Insufficient documentation

## 2021-11-16 NOTE — Patient Instructions (Signed)
Hemorragia subconjuntival Subconjunctival Hemorrhage La hemorragia subconjuntival es el sangrado que se produce entre la parte blanca del ojo (esclertica) y la membrana transparente que recubre la parte externa de este rgano (conjuntiva). Cerca de la superficie del ojo hay muchos vasos sanguneos diminutos. La hemorragia subconjuntival ocurre cuando uno o ms de estos vasos sanguneos se rompen y Water quality scientist, lo que deriva en la aparicin de una mancha roja en el ojo. Esta es similar a Comptroller. En funcin de la magnitud del sangrado, la mancha roja puede cubrir nicamente una pequea zona del ojo o la totalidad de la parte visible de Leisure centre manager. Si se acumula mucha sangre debajo de la conjuntiva, tambin puede haber inflamacin. Las hemorragias subconjuntivales no afectan la visin ni Teaching laboratory technician, pero puede haber sensacin de irritacin ocular si hay inflamacin. En general, las hemorragias subconjuntivales no requieren tratamiento y, [place], desaparecen solas en el trmino de dos a cuatro semanas. Cules son las causas? Esta afeccin puede ser causada por lo siguiente: Un traumatismo leve, como frotarse los ojos con mucha fuerza. Lesiones por contusiones, como por ejemplo practicar deportes o tener contacto con un airbag desplegado. Toser, estornudar o vomitar. Realizar esfuerzos, como ocurre al levantar un objeto pesado. Trastornos mdicos tales como: Presin arterial alta. Diabetes. Cirugas recientes del ojo. Algunos medicamentos, especialmente los anticoagulantes, incluida la aspirina. Otras afecciones, como los tumores en los ojos, los trastornos hemorrgicos o las anomalas de los vasos sanguneos. Las hemorragias subconjuntivales tambin pueden producirse sin una causa aparente. Cules son los signos o sntomas? Los sntomas de esta afeccin incluyen: Una mancha de color rojo oscuro o brillante en la parte blanca del ojo. La zona roja puede: Extenderse hasta cubrir una zona  ms grande del ojo antes de Geneticist, molecular. Cambiar de color, como rosa o amarillo amarronado, antes de Geneticist, molecular. Hinchazn alrededor del ojo. Irritacin leve del ojo. Cmo se diagnostica? Esta afeccin se diagnostica mediante un examen fsico. Si la hemorragia subconjuntival fue causada por un traumatismo, el mdico puede derivarlo a un oculista (oftalmlogo) o a Dietitian para que lo examinen en busca de otras lesiones. Pueden hacerle otras pruebas, por ejemplo: Un examen ocular que incluye una prueba de visin, una revisin ocular con un tipo de microscopio (lmpara de hendidura) y la medicin de la presin ocular. Es posible que se le dilate el ojo, especialmente si la hemorragia subconjuntival fue causada por un traumatismo. Un control de la presin arterial. Anlisis de sangre para detectar la presencia de trastornos hemorrgicos. Si la hemorragia subconjuntival fue causada por un traumatismo, pueden hacerle radiografas o una exploracin por tomografa computarizada (TC) para determinar si hay otras lesiones. Cmo se trata? Generalmente, no se requiere un tratamiento para esta afeccin. Si tiene molestias, el mdico puede recomendarle que se aplique gotas oftlmicas o compresas fras. Siga estas instrucciones en su casa: Tome los medicamentos de venta libre y los recetados solamente como se lo haya indicado el mdico. Aplique las gotas oftlmicas o las compresas fras para Paramedic las molestias como se lo haya indicado el mdico. Evite las [place], las cosas y los entornos que pueden causarle irritacin o lesiones en el ojo. Concurra a todas las visitas de seguimiento. Esto es importante. Comunquese con un mdico si: Siente dolor en el ojo. El sangrado no desaparece en el trmino de 4 semanas. Sigue teniendo nuevas hemorragias subconjuntivales. Solicite ayuda de inmediato si: Tiene cambios en la visin, dificultad para ver o visin doble. Repentinamente, tiene mucha  sensibilidad a la luz. Tiene dolor  de cabeza intenso, vmitos persistentes, confusin o un cansancio que no es normal (letargo). Parece que el ojo sobresale o se protruye de la rbita. Le aparecen moretones en el cuerpo sin motivo. Tiene sangrado en otra parte del cuerpo sin motivo. Estos sntomas pueden representar un problema grave que constituye Radio broadcast assistant. No espere a ver si los sntomas desaparecen. Solicite atencin mdica de inmediato. Comunquese con el servicio de emergencias de su localidad (911 en los [place]). No conduzca por sus propios medios [facility]. Resumen La hemorragia subconjuntival es el sangrado que se produce entre la parte blanca del ojo y la membrana transparente que recubre la parte externa de este rgano. Esta afeccin es similar a un moretn. En general, las hemorragias subconjuntivales no requieren tratamiento y, [place], desaparecen solas en el trmino de dos a cuatro semanas. Aplique las gotas oftlmicas o las compresas fras para Paramedic las [employer] se lo haya indicado el mdico. Esta informacin no tiene Theme park manager el consejo del mdico. Asegrese de hacerle al mdico cualquier pregunta que tenga. Document Revised: 07/05/2020 Document Reviewed: 07/05/2020 Elsevier Patient Education  2023 [employer].

## 2021-11-16 NOTE — Assessment & Plan Note (Signed)
Stable without complications. Explained to patient this condition has to run its course Benign condition without visual complications

## 2021-11-16 NOTE — Progress Notes (Signed)
Lynn Aguirre 51 y.o.   Chief Complaint  Patient presents with   concern about eye being red    Right eye red     HISTORY OF PRESENT ILLNESS: This is a 51 y.o. female complaining of redness to right eye. Woke up with this 2 days ago. Denies injury.  Denies discharge.  Denies visual changes. No other complaints or medical concerns today  HPI   Prior to Admission medications   Medication Sig Start Date End Date Taking? Authorizing Provider  acetic acid-hydrocortisone (VOSOL-HC) OTIC solution Place 3 drops into the left ear 3 (three) times daily. Patient not taking: Reported on 11/16/2021 06/15/19   Georgina Quint, MD  diclofenac (VOLTAREN) 75 MG EC tablet Take 1 tablet (75 mg total) by mouth 2 (two) times daily. Patient not taking: Reported on 05/09/2021 01/25/21   Lenn Sink, DPM  levocetirizine (XYZAL) 5 MG tablet Take 1 tablet (5 mg total) by mouth every evening. Patient not taking: Reported on 11/16/2021 05/30/20   Ellamae Sia, DO  loratadine (CLARITIN) 10 MG tablet Take 10 mg by mouth daily as needed for allergies. Reported on 09/19/2015 Patient not taking: Reported on 05/09/2021    [provider]  methylPREDNISolone (MEDROL DOSEPAK) 4 MG TBPK tablet Sig as indicated Patient not taking: Reported on 03/27/2021 07/08/19   Georgina Quint, MD  mometasone (NASONEX) 50 MCG/ACT nasal spray Place 1 spray into the nose in the morning and at bedtime. For nasal congestion. Patient not taking: Reported on 05/09/2021 05/30/20   Ellamae Sia, DO  omeprazole (PRILOSEC) 40 MG capsule Take 1 capsule (40 mg total) by mouth daily. Patient not taking: Reported on 05/09/2021 12/31/16   Trena Platt D, PA  simethicone (MYLICON) 125 MG chewable tablet Chew 125 mg by mouth every 6 (six) hours as needed for flatulence. Patient not taking: Reported on 05/09/2021    [provider]  sodium chloride (OCEAN) 0.65 % SOLN nasal spray Place 1 spray into both nostrils as needed  for congestion. Patient not taking: Reported on 05/09/2021 06/08/19   Georgina Quint, MD  triamcinolone (NASACORT) 55 MCG/ACT AERO nasal inhaler Place 2 sprays into the nose daily. Patient not taking: Reported on 05/09/2021 07/08/19   Georgina Quint, MD    No Known Allergies  Patient Active Problem List   Diagnosis Date Noted   Other allergic rhinitis 02/24/2020   Sensation of fullness in both ears 02/24/2020   Gastroesophageal reflux disease 01/29/2017   Seasonal allergies 01/29/2017    Past Medical History:  Diagnosis Date   Allergy     Past Surgical History:  Procedure Laterality Date   CESAREAN SECTION  2002   CESAREAN SECTION      Social History   Socioeconomic History   Marital status: Married    Spouse name: Not on file   Number of children: 4   Years of education: Not on file   Highest education level: 9th grade  Occupational History   Not on file  Tobacco Use   Smoking status: Never   Smokeless tobacco: Never  Vaping Use   Vaping Use: Never used  Substance and Sexual Activity   Alcohol use: No   Drug use: No   Sexual activity: Yes    Birth control/protection: None, Condom  Other Topics Concern   Not on file  Social History Narrative   Not on file   Social Determinants of Health   Financial Resource Strain: Not on file  Food Insecurity: Not on file  Transportation Needs: No Transportation Needs (10/21/2018)   PRAPARE - Administrator, Civil Service (Medical): No    Lack of Transportation (Non-Medical): No  Physical Activity: Not on file  Stress: Not on file  Social Connections: Not on file  Intimate Partner Violence: Not on file    Family History  Problem Relation Age of Onset   Diabetes Father      Review of Systems  Constitutional: Negative.  Negative for chills and fever.  HENT: Negative.  Negative for congestion and sore throat.   Eyes:  Positive for redness. Negative for blurred vision, double vision, pain and  discharge.  Respiratory: Negative.  Negative for cough and shortness of breath.   Cardiovascular: Negative.  Negative for chest pain and palpitations.  Gastrointestinal:  Negative for abdominal pain, nausea and vomiting.  Skin: Negative.  Negative for rash.  Neurological:  Negative for dizziness and headaches.  All other systems reviewed and are negative.  Today's Vitals   11/16/21 1606  BP: 134/86  Pulse: 64  Temp: 97.9 F (36.6 C)  TempSrc: Oral  SpO2: 97%  Weight: 202 lb (91.6 kg)  Height: 5\' 1"  (1.549 m)   Body mass index is 38.17 kg/m.   Physical Exam Vitals reviewed.  Constitutional:      Appearance: Normal appearance.  HENT:     Head: Normocephalic.  Eyes:     Extraocular Movements: Extraocular movements intact.     Pupils: Pupils are equal, round, and reactive to light.     Comments: Right subconjunctival hemorrhage temporal side  Cardiovascular:     Rate and Rhythm: Normal rate.  Pulmonary:     Effort: Pulmonary effort is normal.  Skin:    General: Skin is warm and dry.  Neurological:     General: No focal deficit present.     Mental Status: She is alert and oriented to person, place, and time.  Psychiatric:        Mood and Affect: Mood normal.        Behavior: Behavior normal.      ASSESSMENT & PLAN: A total of 35 minutes was spent with the patient and counseling/coordination of care regarding preparing for this visit, review of most recent office visit notes, diagnosis of subconjunctival hemorrhage and treatment, prognosis, documentation and need for follow-up.  Problem List Items Addressed This Visit       Other   Subconjunctival hemorrhage of right eye - Primary    Stable without complications. Explained to patient this condition has to run its course Benign condition without visual complications      Patient Instructions  Hemorragia subconjuntival Subconjunctival Hemorrhage La hemorragia subconjuntival es el sangrado que se produce entre  la parte blanca del ojo (esclertica) y la membrana transparente que recubre la parte externa de este rgano (conjuntiva). Cerca de la superficie del ojo hay muchos vasos sanguneos diminutos. La hemorragia subconjuntival ocurre cuando uno o ms de estos vasos sanguneos se rompen y , lo que deriva en la aparicin de una mancha roja en el ojo. Esta es similar a Water quality scientist. En funcin de la magnitud del sangrado, la mancha roja puede cubrir nicamente una pequea zona del ojo o la totalidad de la parte visible de Comptroller. Si se acumula mucha sangre debajo de la conjuntiva, tambin puede haber inflamacin. Las hemorragias subconjuntivales no afectan la visin ni Leisure centre manager, pero puede haber sensacin de irritacin ocular si hay inflamacin. En general, las  hemorragias subconjuntivales no requieren tratamiento y, [place], desaparecen solas en el trmino de dos a cuatro semanas. Cules son las causas? Esta afeccin puede ser causada por lo siguiente: Un traumatismo leve, como frotarse los ojos con mucha fuerza. Lesiones por contusiones, como por ejemplo practicar deportes o tener contacto con un airbag desplegado. Toser, estornudar o vomitar. Realizar esfuerzos, como ocurre al levantar un objeto pesado. Trastornos mdicos tales como: Presin arterial alta. Diabetes. Cirugas recientes del ojo. Algunos medicamentos, especialmente los anticoagulantes, incluida la aspirina. Otras afecciones, como los tumores en los ojos, los trastornos hemorrgicos o las anomalas de los vasos sanguneos. Las hemorragias subconjuntivales tambin pueden producirse sin una causa aparente. Cules son los signos o sntomas? Los sntomas de esta afeccin incluyen: Una mancha de color rojo oscuro o brillante en la parte blanca del ojo. La zona roja puede: Extenderse hasta cubrir una zona ms grande del ojo antes de Geneticist, molecular. Cambiar de color, como rosa o amarillo amarronado, antes de  Geneticist, molecular. Hinchazn alrededor del ojo. Irritacin leve del ojo. Cmo se diagnostica? Esta afeccin se diagnostica mediante un examen fsico. Si la hemorragia subconjuntival fue causada por un traumatismo, el mdico puede derivarlo a un oculista (oftalmlogo) o a Dietitian para que lo examinen en busca de otras lesiones. Pueden hacerle otras pruebas, por ejemplo: Un examen ocular que incluye una prueba de visin, una revisin ocular con un tipo de microscopio (lmpara de hendidura) y la medicin de la presin ocular. Es posible que se le dilate el ojo, especialmente si la hemorragia subconjuntival fue causada por un traumatismo. Un control de la presin arterial. Anlisis de sangre para detectar la presencia de trastornos hemorrgicos. Si la hemorragia subconjuntival fue causada por un traumatismo, pueden hacerle radiografas o una exploracin por tomografa computarizada (TC) para determinar si hay otras lesiones. Cmo se trata? Generalmente, no se requiere un tratamiento para esta afeccin. Si tiene molestias, el mdico puede recomendarle que se aplique gotas oftlmicas o compresas fras. Siga estas instrucciones en su casa: Tome los medicamentos de venta libre y los recetados solamente como se lo haya indicado el mdico. Aplique las gotas oftlmicas o las compresas fras para Paramedic las molestias como se lo haya indicado el mdico. Evite las [place], las cosas y los entornos que pueden causarle irritacin o lesiones en el ojo. Concurra a todas las visitas de seguimiento. Esto es importante. Comunquese con un mdico si: Siente dolor en el ojo. El sangrado no desaparece en el trmino de 4 semanas. Sigue teniendo nuevas hemorragias subconjuntivales. Solicite ayuda de inmediato si: Tiene cambios en la visin, dificultad para ver o visin doble. Repentinamente, tiene mucha sensibilidad a la luz. Tiene dolor de cabeza intenso, vmitos persistentes, confusin o un cansancio que  no es normal (letargo). Parece que el ojo sobresale o se protruye de la rbita. Le aparecen moretones en el cuerpo sin motivo. Tiene sangrado en otra parte del cuerpo sin motivo. Estos sntomas pueden representar un problema grave que constituye Radio broadcast assistant. No espere a ver si los sntomas desaparecen. Solicite atencin mdica de inmediato. Comunquese con el servicio de emergencias de su localidad (911 en los [place]). No conduzca por sus propios medios [facility]. Resumen La hemorragia subconjuntival es el sangrado que se produce entre la parte blanca del ojo y la membrana transparente que recubre la parte externa de este rgano. Esta afeccin es similar a un moretn. En general, las hemorragias subconjuntivales no requieren tratamiento y, [place], desaparecen solas en el trmino de [place] a Psychologist, educational  semanas. Aplique las gotas oftlmicas o las compresas fras para Paramedic las [employer] se lo haya indicado el mdico. Esta informacin no tiene Theme park manager el consejo del mdico. Asegrese de hacerle al mdico cualquier pregunta que tenga. Document Revised: 07/05/2020 Document Reviewed: 07/05/2020 Elsevier Patient Education  2023 Elsevier Inc.    Edwina Barth, MD [facility]

## 2022-01-15 IMAGING — MG DIGITAL SCREENING BILAT W/ CAD
4 series · 4 of 4 positions shown · non-contrast
Comparison: Previous exam(s).

CLINICAL DATA: Screening.

EXAM:
DIGITAL SCREENING BILATERAL MAMMOGRAM WITH CAD

[R CC]
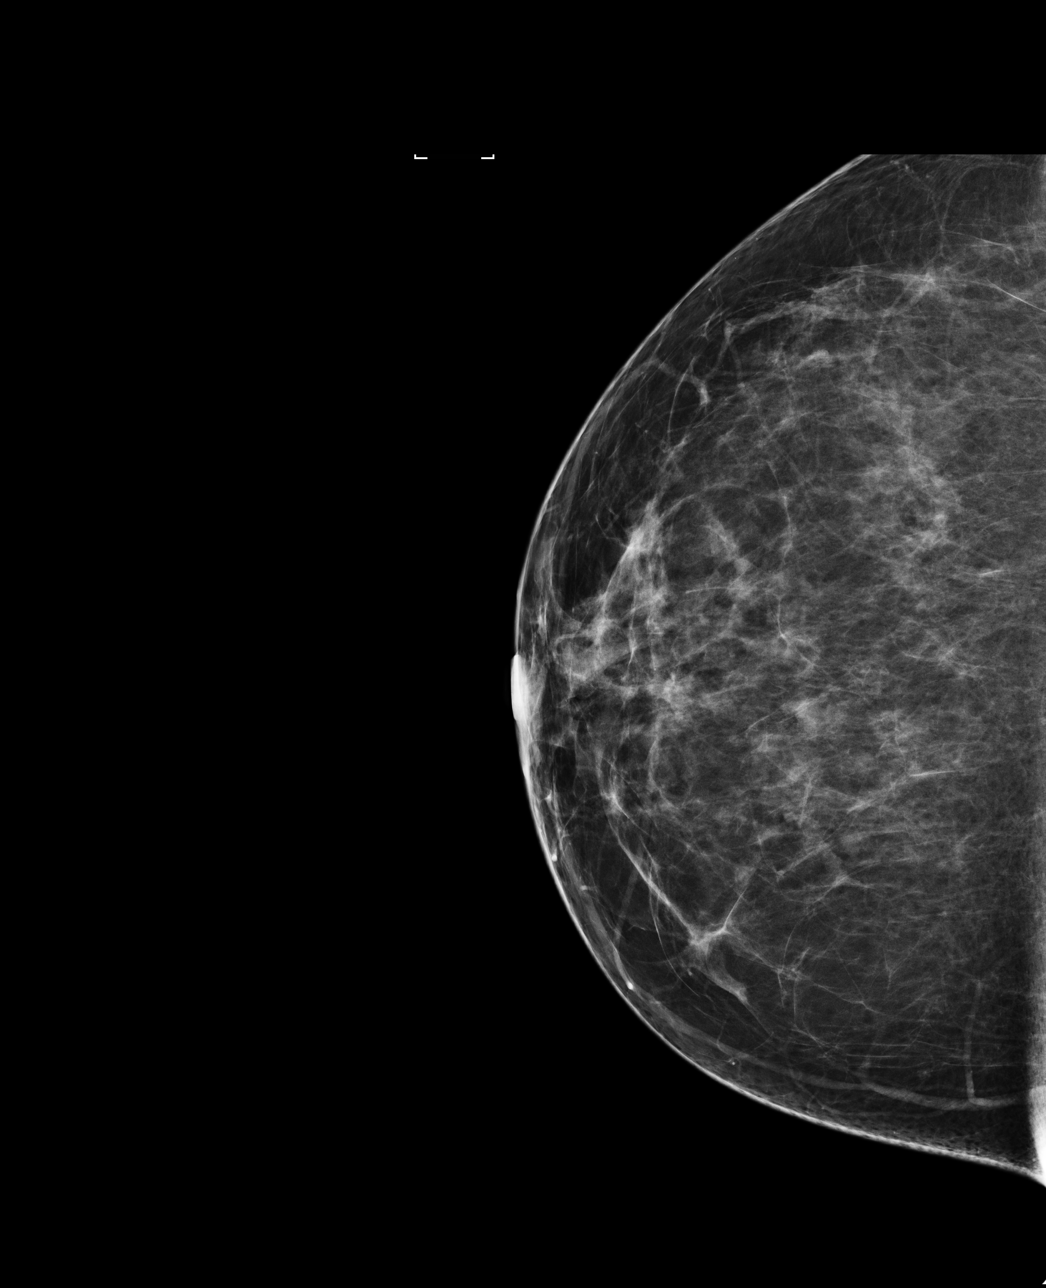

[L MLO]
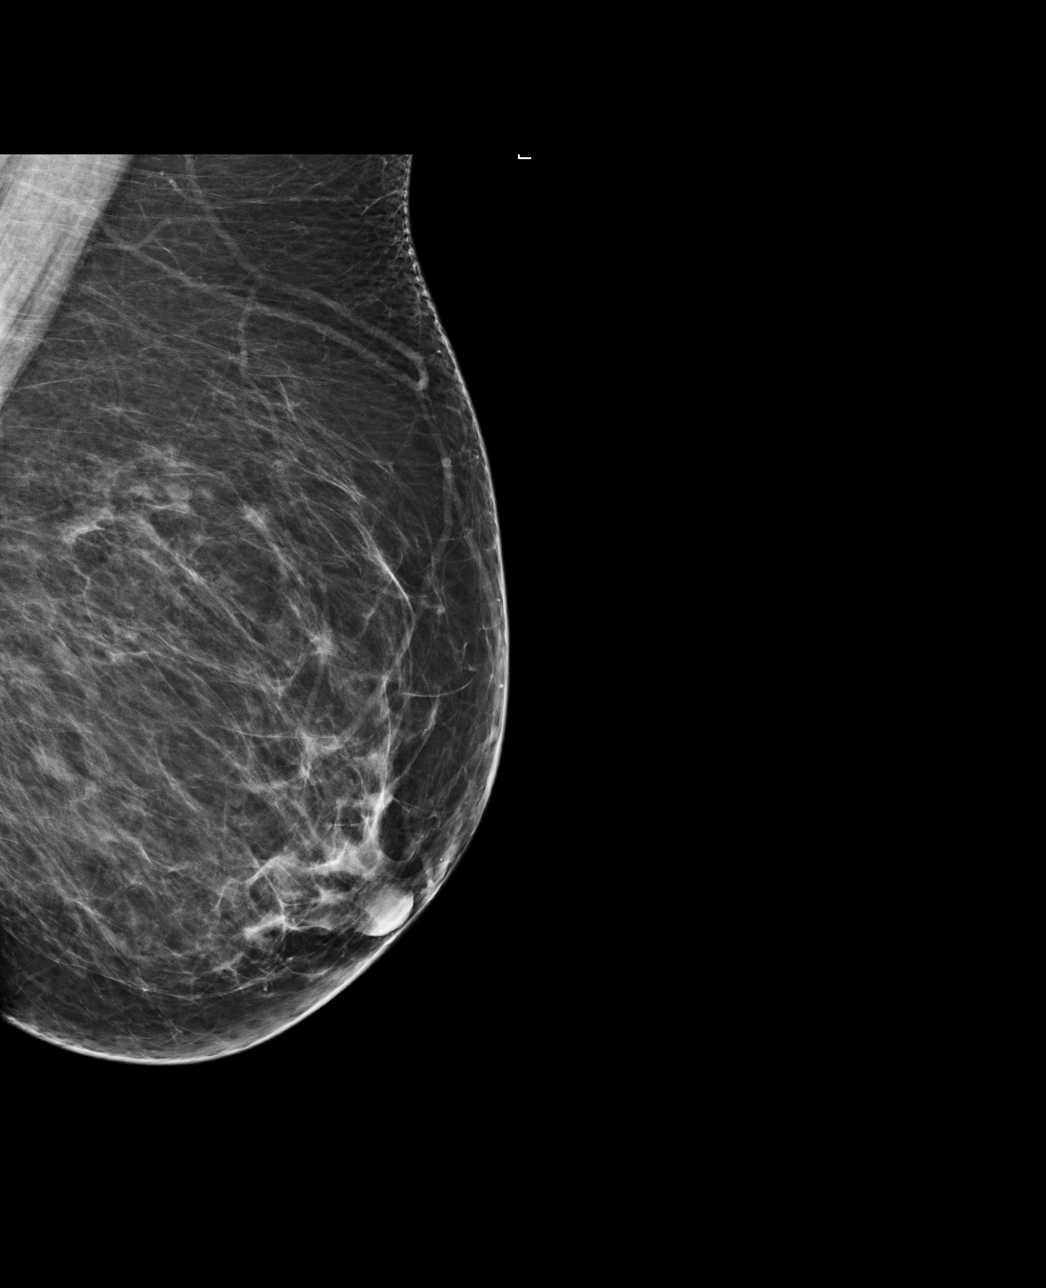

[R MLO]
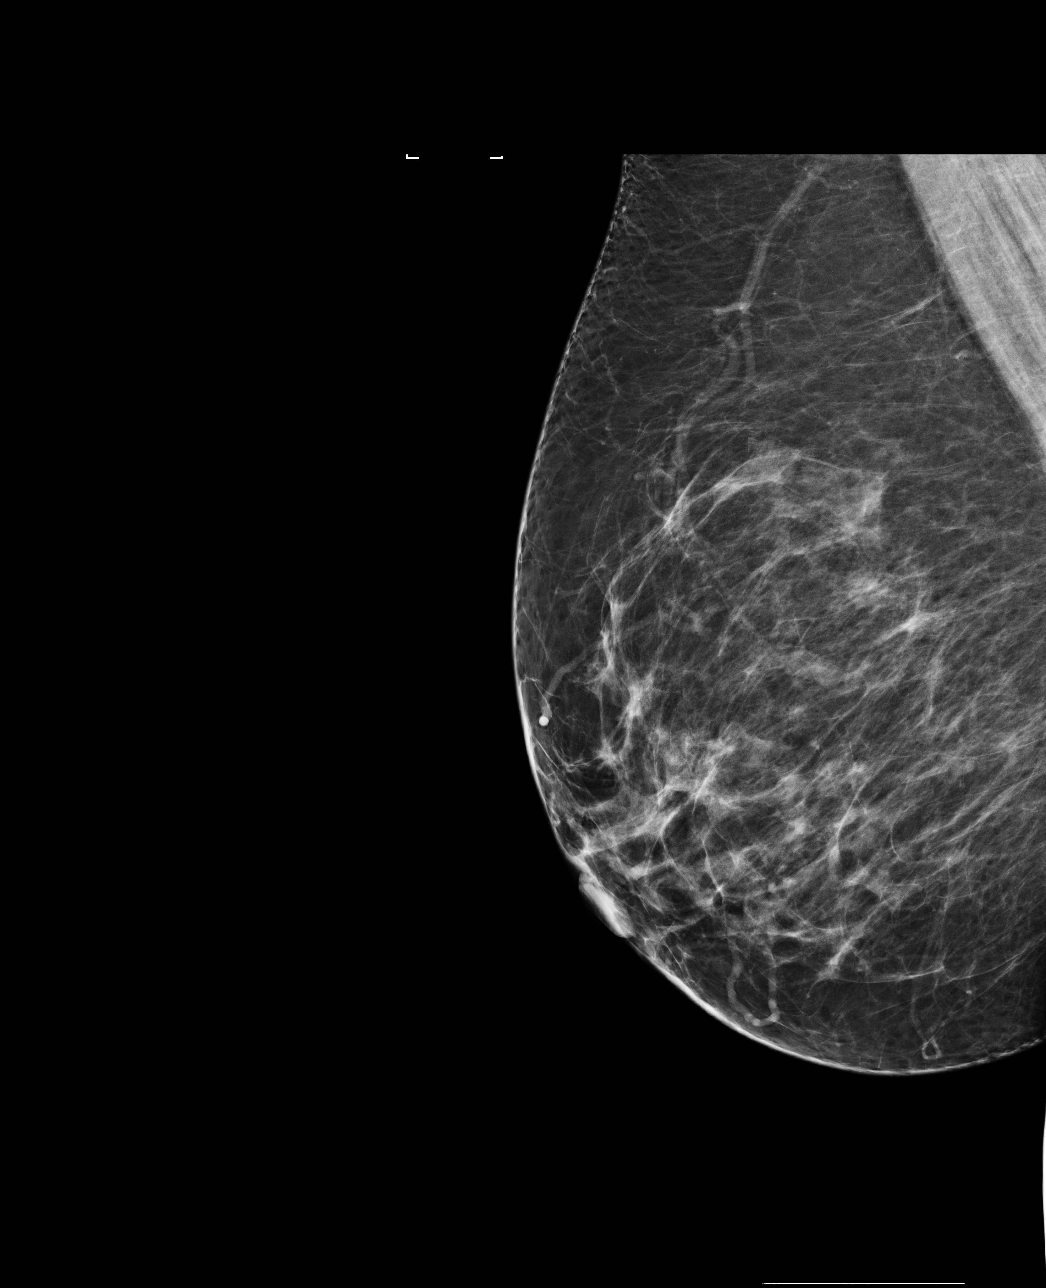

[L CC]
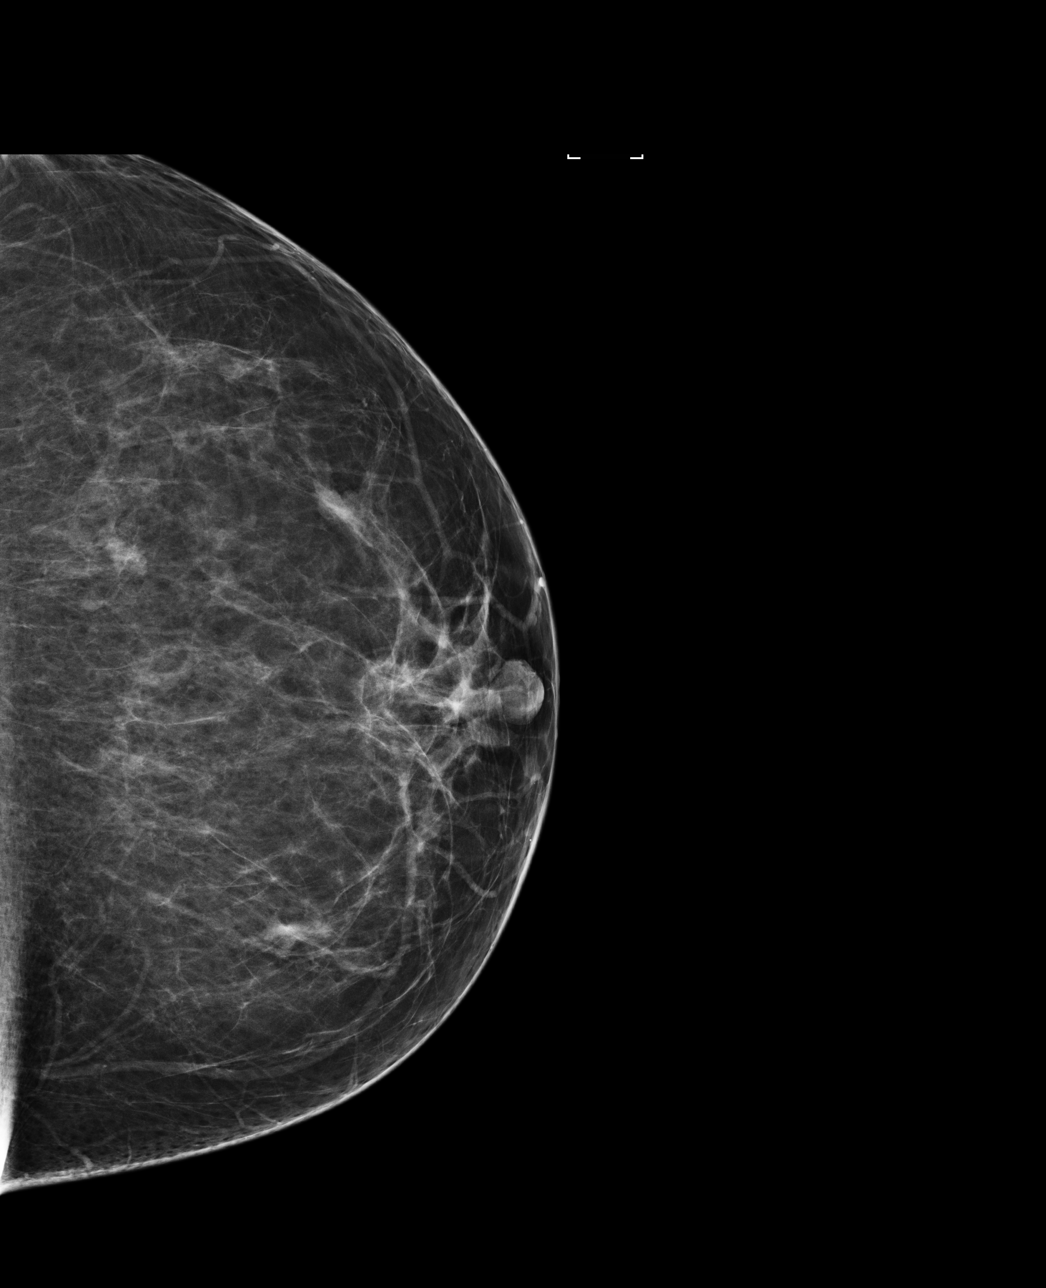

[4 of 4 positions shown; findings below may reference images not displayed]

ACR Breast Density Category b: There are scattered areas of
fibroglandular density.
FINDINGS: There are no findings suspicious for malignancy. Images were
processed with CAD.
IMPRESSION: No mammographic evidence of malignancy. A result letter of this
screening mammogram will be mailed directly to the patient.

RECOMMENDATION:
Screening mammogram in one year. (Code:AS-G-LCT)

BI-RADS CATEGORY  1: Negative.

## 2022-02-21 ENCOUNTER — Other Ambulatory Visit: Payer: Self-pay | Admitting: Emergency Medicine

## 2022-02-21 DIAGNOSIS — Z1231 Encounter for screening mammogram for malignant neoplasm of breast: Secondary | ICD-10-CM

## 2022-04-24 ENCOUNTER — Ambulatory Visit
Admission: RE | Admit: 2022-04-24 | Discharge: 2022-04-24 | Disposition: A | Discharge status: Home / Self Care | Payer: Commercial Managed Care - PPO | Source: Ambulatory Visit | Attending: Emergency Medicine | Admitting: Emergency Medicine

## 2022-04-24 DIAGNOSIS — Z1231 Encounter for screening mammogram for malignant neoplasm of breast: Secondary | ICD-10-CM

## 2023-01-02 ENCOUNTER — Ambulatory Visit (INDEPENDENT_AMBULATORY_CARE_PROVIDER_SITE_OTHER): Payer: Commercial Managed Care - PPO | Admitting: Emergency Medicine

## 2023-01-02 ENCOUNTER — Encounter: Payer: Self-pay | Admitting: Emergency Medicine

## 2023-01-02 VITALS — BP 130/66 | HR 72 | Temp 98.3°F | Ht 61.0 in | Wt 205.0 lb

## 2023-01-02 DIAGNOSIS — B351 Tinea unguium: Secondary | ICD-10-CM | POA: Insufficient documentation

## 2023-01-02 DIAGNOSIS — Z23 Encounter for immunization: Secondary | ICD-10-CM

## 2023-01-02 LAB — COMPREHENSIVE METABOLIC PANEL WITH GFR
ALT: 14 U/L (ref 0–35)
AST: 16 U/L (ref 0–37)
Albumin: 4.2 g/dL (ref 3.5–5.2)
Alkaline Phosphatase: 73 U/L (ref 39–117)
BUN: 14 mg/dL (ref 6–23)
CO2: 27 meq/L (ref 19–32)
Calcium: 9.1 mg/dL (ref 8.4–10.5)
Chloride: 104 meq/L (ref 96–112)
Creatinine, Ser: 0.85 mg/dL (ref 0.40–1.20)
GFR: 78.13 mL/min (ref >60.00)
Glucose, Bld: 86 mg/dL (ref 70–99)
Potassium: 3.8 meq/L (ref 3.5–5.1)
Sodium: 138 meq/L (ref 135–145)
Total Bilirubin: 0.4 mg/dL (ref 0.2–1.2)
Total Protein: 7 g/dL (ref 6.0–8.3)

## 2023-01-02 LAB — CBC WITH DIFFERENTIAL/PLATELET
Basophils Absolute: 0.1 10*3/uL (ref 0.0–0.1)
Basophils Relative: 1 % (ref 0.0–3.0)
Eosinophils Absolute: 0.3 10*3/uL (ref 0.0–0.7)
Eosinophils Relative: 3.3 % (ref 0.0–5.0)
HCT: 40.7 % (ref 36.0–46.0)
Hemoglobin: 13.7 g/dL (ref 12.0–15.0)
Lymphocytes Relative: 31.4 % (ref 12.0–46.0)
Lymphs Abs: 3.3 10*3/uL (ref 0.7–4.0)
MCHC: 33.6 g/dL (ref 30.0–36.0)
MCV: 86.7 fl (ref 78.0–100.0)
Monocytes Absolute: 0.6 10*3/uL (ref 0.1–1.0)
Monocytes Relative: 5.5 % (ref 3.0–12.0)
Neutro Abs: 6.2 10*3/uL (ref 1.4–7.7)
Neutrophils Relative %: 58.8 % (ref 43.0–77.0)
Platelets: 249 10*3/uL (ref 150.0–400.0)
RBC: 4.69 Mil/uL (ref 3.87–5.11)
RDW: 13.8 % (ref 11.5–15.5)
WBC: 10.6 10*3/uL — ABNORMAL HIGH (ref 4.0–10.5)

## 2023-01-02 LAB — HEMOGLOBIN A1C: Hgb A1c MFr Bld: 5.9 % (ref 4.6–6.5)

## 2023-01-02 MED ORDER — TERBINAFINE HCL 250 MG PO TABS: 250.0000 mg | ORAL_TABLET | Freq: Every day | ORAL | 2 refills | Status: AC

## 2023-01-02 NOTE — Patient Instructions (Signed)
Infeccin por hongos en las uas Fungal Nail Infection La infeccin por hongos en las uas es una infeccin frecuente de las uas de los pies o de las manos. Este trastorno [employer] uas de los pies con ms frecuencia que las uas de las manos. Generalmente afecta al dedo gordo del pie. Ms de Neomia Dear ua puede infectarse. Esta afeccin puede transmitirse de [place] persona a otra (es contagiosa). Cules son las causas? La causa de esta afeccin es un hongo, como las levaduras o Lawyer. Son varios los tipos de hongos que pueden causar la infeccin. Estos hongos son frecuentes en las zonas hmedas y clidas. Si las manos o los pies entran en contacto con los hongos, se pueden introducir en una grieta de las uas de las manos o de los pies o en la piel de alrededor y Retail banker una infeccin. Qu incrementa el riesgo? Los siguientes factores pueden hacer que sea ms propenso a Aeronautical engineer afeccin: Ser una persona de edad avanzada. Tener ciertas afecciones, por ejemplo: Pie de atleta. Diabetes. Mala circulacin. Debilitamiento del sistema de defensa del organismo (sistema inmunitario). Caminar descalzo en zonas donde proliferan hongos, como duchas o vestuarios. Usar zapatos y calcetines que [employer]. Tener una ua lastimada o haberse sometido a una ciruga de uas recientemente. Cules son los signos o sntomas? Los sntomas de esta afeccin incluyen: Una mancha plida sobre la ua. Engrosamiento de la ua. Una ua que se torna amarilla, marrn o blanca. Bordes de las uas rugosos o quebradizos. Una ua que se ha desprendido del lecho ungueal. Cmo se diagnostica? Esta afeccin se diagnostica mediante un examen fsico. El mdico podr tomar una muestra de la ua para examinarla y Engineer, manufacturing si tiene hongos. Cmo se trata? No es necesario realizar tratamiento si la infeccin es leve. Si tiene [employer] uas, el tratamiento puede incluir lo  siguiente: Antimicticos que se toman por boca (va oral). Deber tomar los medicamentos durante algunas semanas o meses y no ver los resultados hasta despus de un [place]. Estos medicamentos pueden tener efectos secundarios. Consulte al [employer] a los que debe estar atento. Cremas o esmaltes de uas antimicticos. Se pueden usar junto con los medicamentos antimicticos que se administran por va oral. Tratamiento lser de las uas. Ciruga para extirpar la ua. Esto puede ser [employer] casos ms graves de infecciones. La infeccin puede tardar un largo tiempo en desaparecer, habitualmente hasta un ao. Adems, la infeccin puede regresar. Siga estas indicaciones en su casa: Medicamentos Use o aplquese los medicamentos de venta libre y los recetados solamente como se lo haya indicado el mdico. Consulte al mdico sobre el uso de pomadas mentoladas para las uas de [place]. Cuidado de las uas Crtese las uas con Psychologist, clinical. Lvese y squese las manos y los pies todos [place]. Mantener los pies secos. Para hacer esto: Use calcetines absorbentes y cmbiese los calcetines con frecuencia. Use un tipo de calzado que permita que el aire [place], como sandalias o zapatillas de lona. Deseche los zapatos viejos. Si va al saln de esttica de uas, asegrese de elegir uno en el que se usen instrumentos limpios. Aplquese polvo antimictico en los pies y en los zapatos. Indicaciones generales No comparta elementos personales como toallas o cortauas. No camine descalzo en duchas o vestuarios. Use guantes de goma si est trabajando con sus manos en lugares mojados. Concurra a todas las visitas de seguimiento. Esto es importante. Comunquese con un  mdico si: Tiene enrojecimiento, dolor o pus cerca de la ua del pie o de la [place]. La infeccin no mejora o la infeccin empeora despus de varios meses. Tiene ms problemas de circulacin cerca de la ua del pie o de la  [place]. La ua se pone de color marrn o negro y ese color se extiende a la piel circundante. Resumen La infeccin por hongos en las uas es una infeccin frecuente de las uas de los pies o de las manos. No es necesario realizar tratamiento si la infeccin es leve. Si tiene [employer] uas, el tratamiento puede incluir la administracin de medicamentos por va oral y la aplicacin de un medicamento en las uas. La infeccin puede tardar un largo tiempo en desaparecer, habitualmente hasta un ao. Adems, la infeccin puede regresar. Use o aplquese los medicamentos de venta libre y los recetados solamente como se lo haya indicado el mdico. Esta informacin no tiene Theme park manager el consejo del mdico. Asegrese de hacerle al mdico cualquier pregunta que tenga. Document Revised: 08/02/2020 Document Reviewed: 08/02/2020 Elsevier Patient Education  2024 [employer].

## 2023-01-02 NOTE — Assessment & Plan Note (Signed)
Chronic and not responding to multiple over-the-counter topical medications Recommend to start Lamisil 250 mg daily for 30 days Needs CMP done today Recommend podiatry evaluation Referral placed today

## 2023-01-02 NOTE — Progress Notes (Signed)
Lynn Aguirre 52 y.o.   Chief Complaint  Patient presents with   Nail Problem    Patient states she has some fungus on her toe nail, both feet, patient has took OTC medication and it is not working    HISTORY OF PRESENT ILLNESS: Acute problem visit today. This is a 52 y.o. female complaining of fungal infection to both feet Chronic problem but worse the last several weeks Has tried over-the-counter medications for the last couple years but not working  HPI   Prior to Admission medications   Medication Sig Start Date End Date Taking? Authorizing Provider  acetic acid-hydrocortisone (VOSOL-HC) OTIC solution Place 3 drops into the left ear 3 (three) times daily. Patient not taking: Reported on 11/16/2021 06/15/19   Georgina Quint, MD  diclofenac (VOLTAREN) 75 MG EC tablet Take 1 tablet (75 mg total) by mouth 2 (two) times daily. Patient not taking: Reported on 05/09/2021 01/25/21   Lenn Sink, DPM  levocetirizine (XYZAL) 5 MG tablet Take 1 tablet (5 mg total) by mouth every evening. Patient not taking: Reported on 11/16/2021 05/30/20   Ellamae Sia, DO  loratadine (CLARITIN) 10 MG tablet Take 10 mg by mouth daily as needed for allergies. Reported on 09/19/2015 Patient not taking: Reported on 05/09/2021    [provider]  methylPREDNISolone (MEDROL DOSEPAK) 4 MG TBPK tablet Sig as indicated Patient not taking: Reported on 03/27/2021 07/08/19   Georgina Quint, MD  mometasone (NASONEX) 50 MCG/ACT nasal spray Place 1 spray into the nose in the morning and at bedtime. For nasal congestion. Patient not taking: Reported on 05/09/2021 05/30/20   Ellamae Sia, DO  omeprazole (PRILOSEC) 40 MG capsule Take 1 capsule (40 mg total) by mouth daily. Patient not taking: Reported on 05/09/2021 12/31/16   Trena Platt D, PA  simethicone (MYLICON) 125 MG chewable tablet Chew 125 mg by mouth every 6 (six) hours as needed for flatulence. Patient not taking: Reported on 05/09/2021     [provider]  sodium chloride (OCEAN) 0.65 % SOLN nasal spray Place 1 spray into both nostrils as needed for congestion. Patient not taking: Reported on 05/09/2021 06/08/19   Georgina Quint, MD  triamcinolone (NASACORT) 55 MCG/ACT AERO nasal inhaler Place 2 sprays into the nose daily. Patient not taking: Reported on 05/09/2021 07/08/19   Georgina Quint, MD    No Known Allergies  Patient Active Problem List   Diagnosis Date Noted   Subconjunctival hemorrhage of right eye 11/16/2021   Other allergic rhinitis 02/24/2020   Sensation of fullness in both ears 02/24/2020   Gastroesophageal reflux disease 01/29/2017   Seasonal allergies 01/29/2017    Past Medical History:  Diagnosis Date   Allergy     Past Surgical History:  Procedure Laterality Date   CESAREAN SECTION  2002   CESAREAN SECTION      Social History   Socioeconomic History   Marital status: Married    Spouse name: Not on file   Number of children: 4   Years of education: Not on file   Highest education level: 9th grade  Occupational History   Not on file  Tobacco Use   Smoking status: Never   Smokeless tobacco: Never  Vaping Use   Vaping status: Never Used  Substance and Sexual Activity   Alcohol use: No   Drug use: No   Sexual activity: Yes    Birth control/protection: None, Condom  Other Topics Concern   Not on  file  Social History Narrative   Not on file   Social Determinants of Health   Financial Resource Strain: Not on file  Food Insecurity: Not on file  Transportation Needs: No Transportation Needs (10/21/2018)   PRAPARE - Administrator, Civil Service (Medical): No    Lack of Transportation (Non-Medical): No  Physical Activity: Not on file  Stress: Not on file  Social Connections: Unknown (08/28/2021)   Received from [facility]   Social Network    Social Network: Not on file  Intimate Partner Violence: Unknown (07/20/2021)   Received from  [facility]   HITS    Physically Hurt: Not on file    Insult or Talk Down To: Not on file    Threaten Physical Harm: Not on file    Scream or Curse: Not on file    Family History  Problem Relation Age of Onset   Diabetes Father      Review of Systems  Constitutional: Negative.  Negative for chills and fever.  HENT: Negative.  Negative for congestion and sore throat.   Respiratory: Negative.  Negative for cough and shortness of breath.   Cardiovascular: Negative.  Negative for chest pain and palpitations.  Gastrointestinal:  Negative for abdominal pain, nausea and vomiting.  Genitourinary: Negative.  Negative for dysuria and hematuria.  Skin: Negative.  Negative for rash.  Neurological: Negative.  Negative for dizziness and headaches.  All other systems reviewed and are negative.   Vitals:   01/02/23 1530  BP: 130/66  Pulse: 72  Temp: 98.3 F (36.8 C)  SpO2: 94%    Physical Exam Vitals reviewed.  Constitutional:      Appearance: Normal appearance.  HENT:     Head: Normocephalic.  Eyes:     Extraocular Movements: Extraocular movements intact.  Cardiovascular:     Rate and Rhythm: Normal rate.  Pulmonary:     Effort: Pulmonary effort is normal.  Skin:    General: Skin is warm and dry.     Comments: Feet: Onychomycosis both great toenails  Neurological:     Mental Status: She is alert and oriented to person, place, and time.  Psychiatric:        Mood and Affect: Mood normal.        Behavior: Behavior normal.      ASSESSMENT & PLAN: A total of 35 minutes was spent with the patient and counseling/coordination of care regarding preparing for this visit, review of most recent office visit notes, review of chronic medical problems, review of all medications, need for blood work today, diagnosis of onychomycosis and need for podiatry evaluation, treatment options for onychomycosis, prognosis, documentation and need for follow-up.  Problem List  Items Addressed This Visit       Musculoskeletal and Integument   Onychomycosis - Primary    Chronic and not responding to multiple over-the-counter topical medications Recommend to start Lamisil 250 mg daily for 30 days Needs CMP done today Recommend podiatry evaluation Referral placed today      Relevant Medications   terbinafine (LAMISIL) 250 MG tablet   Other Relevant Orders   Ambulatory referral to Podiatry   CBC with Differential/Platelet   Comprehensive metabolic panel   Hemoglobin A1c   Other Visit Diagnoses     Need for vaccination       Relevant Orders   Flu vaccine trivalent PF, 6mos and older(Flulaval,Afluria,Fluarix,Fluzone) (Completed)      Patient Instructions  Infeccin por hongos  en las uas Fungal Nail Infection La infeccin por hongos en las uas es una infeccin frecuente de las uas de los pies o de las manos. Este trastorno [employer] uas de los pies con ms frecuencia que las uas de las manos. Generalmente afecta al dedo gordo del pie. Ms de Neomia Dear ua puede infectarse. Esta afeccin puede transmitirse de [place] persona a otra (es contagiosa). Cules son las causas? La causa de esta afeccin es un hongo, como las levaduras o Lawyer. Son varios los tipos de hongos que pueden causar la infeccin. Estos hongos son frecuentes en las zonas hmedas y clidas. Si las manos o los pies entran en contacto con los hongos, se pueden introducir en una grieta de las uas de las manos o de los pies o en la piel de alrededor y Retail banker una infeccin. Qu incrementa el riesgo? Los siguientes factores pueden hacer que sea ms propenso a Aeronautical engineer afeccin: Ser una persona de edad avanzada. Tener ciertas afecciones, por ejemplo: Pie de atleta. Diabetes. Mala circulacin. Debilitamiento del sistema de defensa del organismo (sistema inmunitario). Caminar descalzo en zonas donde proliferan hongos, como duchas o vestuarios. Usar zapatos y calcetines que [employer] Products and Chemicals. Tener una ua lastimada o haberse sometido a una ciruga de uas recientemente. Cules son los signos o sntomas? Los sntomas de esta afeccin incluyen: Una mancha plida sobre la ua. Engrosamiento de la ua. Una ua que se torna amarilla, marrn o blanca. Bordes de las uas rugosos o quebradizos. Una ua que se ha desprendido del lecho ungueal. Cmo se diagnostica? Esta afeccin se diagnostica mediante un examen fsico. El mdico podr tomar una muestra de la ua para examinarla y Engineer, manufacturing si tiene hongos. Cmo se trata? No es necesario realizar tratamiento si la infeccin es leve. Si tiene [employer] uas, el tratamiento puede incluir lo siguiente: Antimicticos que se toman por boca (va oral). Deber tomar los medicamentos durante algunas semanas o meses y no ver los resultados hasta despus de un [place]. Estos medicamentos pueden tener efectos secundarios. Consulte al [employer] a los que debe estar atento. Cremas o esmaltes de uas antimicticos. Se pueden usar junto con los medicamentos antimicticos que se administran por va oral. Tratamiento lser de las uas. Ciruga para extirpar la ua. Esto puede ser [employer] casos ms graves de infecciones. La infeccin puede tardar un largo tiempo en desaparecer, habitualmente hasta un ao. Adems, la infeccin puede regresar. Siga estas indicaciones en su casa: Medicamentos Use o aplquese los medicamentos de venta libre y los recetados solamente como se lo haya indicado el mdico. Consulte al mdico sobre el uso de pomadas mentoladas para las uas de [place]. Cuidado de las uas Crtese las uas con Psychologist, clinical. Lvese y squese las manos y los pies todos [place]. Mantener los pies secos. Para hacer esto: Use calcetines absorbentes y cmbiese los calcetines con frecuencia. Use un tipo de calzado que permita que el aire [place]var[place], como sandalias o zapatillas de  lona. Deseche los zapatos viejos. Si va al saln de esttica de uas, asegrese de elegir uno en el que se usen instrumentos limpios. Aplquese polvo antimictico en los pies y en los zapatos. Indicaciones generales No comparta elementos personales como toallas o cortauas. No camine descalzo en duchas o vestuarios. Use guantes de goma si est trabajando con sus manos en lugares mojados. Concurra a todas las visitas de seguimiento. Esto es importante. Comunquese con un mdico si:  Tiene enrojecimiento, dolor o pus cerca de la ua del pie o de la [place]. La infeccin no mejora o la infeccin empeora despus de varios meses. Tiene ms problemas de circulacin cerca de la ua del pie o de la [place]. La ua se pone de color marrn o negro y ese color se extiende a la piel circundante. Resumen La infeccin por hongos en las uas es una infeccin frecuente de las uas de los pies o de las manos. No es necesario realizar tratamiento si la infeccin es leve. Si tiene [employer] uas, el tratamiento puede incluir la administracin de medicamentos por va oral y la aplicacin de un medicamento en las uas. La infeccin puede tardar un largo tiempo en desaparecer, habitualmente hasta un ao. Adems, la infeccin puede regresar. Use o aplquese los medicamentos de venta libre y los recetados solamente como se lo haya indicado el mdico. Esta informacin no tiene Theme park manager el consejo del mdico. Asegrese de hacerle al mdico cualquier pregunta que tenga. Document Revised: 08/02/2020 Document Reviewed: 08/02/2020 Elsevier Patient Education  2024 Elsevier Inc.    Edwina Barth, MD [facility]

## 2023-01-16 ENCOUNTER — Ambulatory Visit: Payer: Commercial Managed Care - PPO | Admitting: Podiatry

## 2023-01-16 ENCOUNTER — Encounter: Payer: Self-pay | Admitting: Podiatry

## 2023-01-16 DIAGNOSIS — B079 Viral wart, unspecified: Secondary | ICD-10-CM | POA: Diagnosis not present

## 2023-01-16 MED ORDER — FLUOROURACIL 5 % EX CREA: TOPICAL_CREAM | Freq: Two times a day (BID) | CUTANEOUS | 2 refills | Status: AC

## 2023-01-17 NOTE — Progress Notes (Signed)
Subjective:   Patient ID: Lynn Aguirre, female   DOB: 52 y.o.   MRN: [id number]   HPI Patient presents with interpreter with lesions around her hallux nails bilateral that is become painful over the last number of months.  States that she thinks it is nail fungus but that it is not really the nail   ROS      Objective:  Physical Exam  Neurovascular status intact with damaged tissue around each hallux nail localized that upon debridement shows pinpoint bleeding     Assessment:  Probability for verruca plantaris bilateral     Plan:  H&P reviewed recommended debridement which was accomplished followed by chemical application sterile dressing home Efudex reappoint 1 month to reevaluate

## 2023-01-18 ENCOUNTER — Other Ambulatory Visit: Payer: Self-pay | Admitting: Emergency Medicine

## 2023-01-18 DIAGNOSIS — Z1211 Encounter for screening for malignant neoplasm of colon: Secondary | ICD-10-CM

## 2023-01-18 DIAGNOSIS — Z1212 Encounter for screening for malignant neoplasm of rectum: Secondary | ICD-10-CM

## 2023-02-13 ENCOUNTER — Encounter: Payer: Self-pay | Admitting: Podiatry

## 2023-02-13 ENCOUNTER — Ambulatory Visit (INDEPENDENT_AMBULATORY_CARE_PROVIDER_SITE_OTHER): Payer: Commercial Managed Care - PPO | Admitting: Podiatry

## 2023-02-13 DIAGNOSIS — B079 Viral wart, unspecified: Secondary | ICD-10-CM | POA: Diagnosis not present

## 2023-02-14 NOTE — Progress Notes (Signed)
Subjective:   Patient ID: Lynn Aguirre, female   DOB: 52 y.o.   MRN: [id number]   HPI Patient states feeling a lot better still has some slight formation but overall improved   ROS      Objective:  Physical Exam  Neurovascular status intact several plantar lesions but the lesions along the big toe left are much improved over where they were     Assessment:  Improvement verruca plantaris     Plan:  Reviewed the continuation of topical medicine and that these are viruses we may see reoccurrence if we do I may need to work on it again and patient will be seen back to recheck on an as-needed basis

## 2023-06-11 ENCOUNTER — Other Ambulatory Visit: Payer: Self-pay | Admitting: Emergency Medicine

## 2023-06-11 DIAGNOSIS — Z0000 Encounter for general adult medical examination without abnormal findings: Secondary | ICD-10-CM

## 2023-06-13 ENCOUNTER — Ambulatory Visit
Admission: RE | Admit: 2023-06-13 | Discharge: 2023-06-13 | Disposition: A | Discharge status: Home / Self Care | Payer: Commercial Managed Care - PPO | Source: Ambulatory Visit | Attending: Emergency Medicine | Admitting: Emergency Medicine

## 2023-06-13 DIAGNOSIS — Z0000 Encounter for general adult medical examination without abnormal findings: Secondary | ICD-10-CM

## 2023-06-18 ENCOUNTER — Encounter: Payer: Self-pay | Admitting: Emergency Medicine

## 2024-01-21 ENCOUNTER — Encounter: Admitting: Emergency Medicine

## 2024-01-23 ENCOUNTER — Ambulatory Visit: Payer: Self-pay | Admitting: Emergency Medicine

## 2024-01-23 ENCOUNTER — Encounter: Payer: Self-pay | Admitting: Emergency Medicine

## 2024-01-23 ENCOUNTER — Ambulatory Visit: Admitting: Emergency Medicine

## 2024-01-23 VITALS — BP 128/78 | HR 75 | Temp 98.5°F | Ht 61.0 in | Wt 205.0 lb

## 2024-01-23 DIAGNOSIS — Z23 Encounter for immunization: Secondary | ICD-10-CM

## 2024-01-23 DIAGNOSIS — Z1211 Encounter for screening for malignant neoplasm of colon: Secondary | ICD-10-CM

## 2024-01-23 DIAGNOSIS — Z130 Encounter for screening for diseases of the blood and blood-forming organs and certain disorders involving the immune mechanism: Secondary | ICD-10-CM | POA: Diagnosis not present

## 2024-01-23 DIAGNOSIS — Z13220 Encounter for screening for lipoid disorders: Secondary | ICD-10-CM

## 2024-01-23 DIAGNOSIS — Z13228 Encounter for screening for other metabolic disorders: Secondary | ICD-10-CM

## 2024-01-23 DIAGNOSIS — Z0000 Encounter for general adult medical examination without abnormal findings: Secondary | ICD-10-CM

## 2024-01-23 DIAGNOSIS — Z1329 Encounter for screening for other suspected endocrine disorder: Secondary | ICD-10-CM | POA: Diagnosis not present

## 2024-01-23 LAB — LIPID PANEL
Cholesterol: 276 mg/dL — ABNORMAL HIGH (ref 0–200)
HDL: 48.3 mg/dL (ref >39.00)
LDL Cholesterol: 183 mg/dL — ABNORMAL HIGH (ref 0–99)
NonHDL: 227.58
Total CHOL/HDL Ratio: 6
Triglycerides: 223 mg/dL — ABNORMAL HIGH (ref 0.0–149.0)
VLDL: 44.6 mg/dL — ABNORMAL HIGH (ref 0.0–40.0)

## 2024-01-23 LAB — COMPREHENSIVE METABOLIC PANEL WITH GFR
ALT: 19 U/L (ref 0–35)
AST: 24 U/L (ref 0–37)
Albumin: 4.4 g/dL (ref 3.5–5.2)
Alkaline Phosphatase: 62 U/L (ref 39–117)
BUN: 9 mg/dL (ref 6–23)
CO2: 26 meq/L (ref 19–32)
Calcium: 9 mg/dL (ref 8.4–10.5)
Chloride: 106 meq/L (ref 96–112)
Creatinine, Ser: 0.73 mg/dL (ref 0.40–1.20)
GFR: 93.09 mL/min (ref >60.00)
Glucose, Bld: 101 mg/dL — ABNORMAL HIGH (ref 70–99)
Potassium: 4.2 meq/L (ref 3.5–5.1)
Sodium: 140 meq/L (ref 135–145)
Total Bilirubin: 0.6 mg/dL (ref 0.2–1.2)
Total Protein: 6.9 g/dL (ref 6.0–8.3)

## 2024-01-23 LAB — VITAMIN D 25 HYDROXY (VIT D DEFICIENCY, FRACTURES): VITD: 12.06 ng/mL — ABNORMAL LOW (ref 30.00–100.00)

## 2024-01-23 LAB — CBC WITH DIFFERENTIAL/PLATELET
Basophils Absolute: 0.1 K/uL (ref 0.0–0.1)
Basophils Relative: 0.9 % (ref 0.0–3.0)
Eosinophils Absolute: 0.3 K/uL (ref 0.0–0.7)
Eosinophils Relative: 4.7 % (ref 0.0–5.0)
HCT: 41.5 % (ref 36.0–46.0)
Hemoglobin: 13.6 g/dL (ref 12.0–15.0)
Lymphocytes Relative: 32.7 % (ref 12.0–46.0)
Lymphs Abs: 2.2 K/uL (ref 0.7–4.0)
MCHC: 32.9 g/dL (ref 30.0–36.0)
MCV: 86.6 fl (ref 78.0–100.0)
Monocytes Absolute: 0.4 K/uL (ref 0.1–1.0)
Monocytes Relative: 5.4 % (ref 3.0–12.0)
Neutro Abs: 3.8 K/uL (ref 1.4–7.7)
Neutrophils Relative %: 56.3 % (ref 43.0–77.0)
Platelets: 240 K/uL (ref 150.0–400.0)
RBC: 4.79 Mil/uL (ref 3.87–5.11)
RDW: 14.1 % (ref 11.5–15.5)
WBC: 6.8 K/uL (ref 4.0–10.5)

## 2024-01-23 LAB — VITAMIN B12: Vitamin B-12: 330 pg/mL (ref 211–911)

## 2024-01-23 LAB — HEMOGLOBIN A1C: Hgb A1c MFr Bld: 5.8 % (ref 4.6–6.5)

## 2024-01-23 LAB — TSH: TSH: 1.39 u[IU]/mL (ref 0.35–5.50)

## 2024-01-23 NOTE — Patient Instructions (Signed)
 Mantenimiento de Radiographer, therapeutic en las mujeres Health Maintenance, Female Adoptar un estilo de vida saludable y recibir atencin preventiva son importantes para promover la salud y Counsellor. Consulte al mdico sobre: El esquema adecuado para hacerse pruebas y exmenes peridicos. Cosas que puede hacer por su cuenta para prevenir enfermedades y [place]he sano. Qu debo saber sobre la dieta, el peso y el ejercicio? Consuma una dieta saludable  Consuma una dieta que incluya muchas verduras, frutas, productos lcteos con bajo contenido de [place] y Associate Professor. No consuma muchos alimentos ricos en grasas slidas, azcares agregados o sodio. Mantenga un peso saludable El ndice de masa muscular [facility]) se [place] para identificar problemas de [place]. Proporciona una estimacin de la grasa corporal basndose en el peso y la altura. Su mdico puede ayudarle a Engineer, site IMC y a Personnel officer o Pharmacologist un peso saludable. Haga ejercicio con regularidad Haga ejercicio con regularidad. Esta es una de las prcticas ms importantes que puede hacer por su salud. La [employer]son de los adultos deben seguir estas pautas: Education officer, environmental, al menos, 150 minutos de actividad fsica por semana. El ejercicio debe aumentar la frecuencia cardaca y Media planner transpirar (ejercicio de intensidad moderada). Hacer ejercicios de fortalecimiento por lo [employer] por semana. Agregue esto a su plan de ejercicio de intensidad moderada. Pase menos tiempo sentada. Incluso la actividad fsica ligera puede ser beneficiosa. Controle sus niveles de colesterol y lpidos en la sangre Comience a realizarse anlisis de lpidos y Oncologist en la sangre a los 20 aos y luego reptalos cada 5 aos. Hgase controlar los niveles de colesterol con mayor frecuencia si: Sus niveles de lpidos y colesterol son altos. Es mayor de 40 aos. Presenta un alto riesgo de padecer enfermedades cardacas. Qu debo saber sobre las pruebas de deteccin del  cncer? Segn su historia clnica y sus antecedentes familiares, es posible que deba realizarse pruebas de deteccin del cncer en diferentes edades. Esto puede incluir pruebas de deteccin de lo siguiente: Cncer de mama. Cncer de cuello uterino. Cncer colorrectal. Cncer de piel. Cncer de pulmn. Qu debo saber sobre la enfermedad cardaca, la diabetes y la hipertensin arterial? Presin arterial y enfermedad cardaca La hipertensin arterial causa enfermedades cardacas y [place] el riesgo de accidente cerebrovascular. Es ms probable que esto se manifieste en las personas que tienen lecturas de presin arterial alta o tienen sobrepeso. Hgase controlar la presin arterial: Cada 3 a 5 aos si tiene entre 18 y 50 aos. Todos los aos si es mayor de 40 aos. Diabetes Realcese exmenes de deteccin de la diabetes con regularidad. Este anlisis revisa el nivel de azcar en la sangre en [place]. Hgase las pruebas de deteccin: Cada tres aos despus de los 40 aos de edad si tiene un peso normal y un bajo riesgo de padecer diabetes. Con ms frecuencia y a partir de [place] edad inferior si tiene sobrepeso o un alto riesgo de padecer diabetes. Qu debo saber sobre la prevencin de infecciones? Hepatitis B Si tiene un riesgo ms alto de contraer hepatitis B, debe someterse a un examen de deteccin de este virus. Hable con el mdico para averiguar si tiene riesgo de contraer la infeccin por hepatitis B. Hepatitis C Se recomienda el anlisis a: [employer] 1946 y 1965. Todas las personas que tengan un riesgo de haber contrado hepatitis C. Enfermedades de transmisin sexual (ETS) Hgase las pruebas de Airline pilot de ITS, incluidas la gonorrea y la clamidia, si: Es sexualmente activa y es Adult nurse de 24  aos. Es mayor de 24 aos, y el mdico le informa que corre riesgo de tener este tipo de infecciones. La actividad sexual ha cambiado desde que le hicieron la ltima prueba de  deteccin y tiene un riesgo mayor de Warehouse manager clamidia o Copy. Pregntele al mdico si usted tiene riesgo. Pregntele al mdico si usted tiene un alto riesgo de Primary school teacher VIH. El mdico tambin puede recomendarle un medicamento recetado para ayudar a evitar la infeccin por el VIH. Si elige tomar medicamentos para prevenir el VIH, primero debe [employer] de deteccin del VIH. Luego debe hacerse anlisis cada 3 meses mientras est tomando los medicamentos. Embarazo Si est por dejar de Armed forces training and education officer (fase premenopusica) y usted puede quedar [place], busque asesoramiento antes de [place]. Tome de 400 a 800 microgramos (mcg) de cido [employer] si [place]. Pida mtodos de control de la natalidad (anticonceptivos) si desea evitar un embarazo no deseado. Osteoporosis y [place] La osteoporosis es una enfermedad en la que los huesos pierden los minerales y la fuerza por el avance de la edad. El resultado pueden ser fracturas en los [place]. Si tiene 65 aos o ms, o si est en riesgo de sufrir osteoporosis y fracturas, pregunte a su mdico si debe: Hacerse pruebas de deteccin de prdida sea. Tomar un suplemento de calcio o de vitamina D para reducir el riesgo de fracturas. Recibir terapia de reemplazo hormonal (TRH) para tratar los sntomas de la menopausia. Siga estas indicaciones en su casa: Consumo de alcohol No beba alcohol si: Su mdico le indica no hacerlo. Est embarazada, puede estar embarazada o est tratando de [place]. Si bebe alcohol: Limite la cantidad que bebe a lo siguiente: De 0 a 1 bebida por da. Sepa cunta cantidad de alcohol hay en las bebidas que toma. En los [address], una medida equivale a una botella de cerveza de 12 oz (355 ml), un vaso de vino de 5 oz (148 ml) o un vaso de una bebida alcohlica de alta graduacin de 1 oz (44 ml). Estilo de vida No consuma ningn producto que contenga nicotina o tabaco. Estos  productos incluyen cigarrillos, tabaco para Theatre manager y aparatos de vapeo, como los Administrator, Civil Service. Si necesita ayuda para dejar de consumir estos productos, consulte al mdico. No consuma drogas. No comparta agujas. Solicite ayuda a su mdico si necesita apoyo o informacin para abandonar las drogas. Indicaciones generales Realcese los estudios de rutina de [address], dentales y de Wellsite geologist. Mantngase al da con las vacunas. Infrmele a su mdico si: Se siente deprimida con frecuencia. Alguna vez ha sido vctima de [place] o no se siente seguro en su casa. Resumen Adoptar un estilo de vida saludable y recibir atencin preventiva son importantes para promover la salud y Counsellor. Siga las instrucciones del mdico acerca de una dieta saludable, el ejercicio y la realizacin de pruebas o exmenes para Hotel manager. Siga las instrucciones del mdico con respecto al control del colesterol y la presin arterial. Esta informacin no tiene Theme park manager el consejo del mdico. Asegrese de hacerle al mdico cualquier pregunta que tenga. Document Revised: 09/08/2020 Document Reviewed: 09/08/2020 Elsevier Patient Education  2024 [employer].

## 2024-01-23 NOTE — Progress Notes (Signed)
 Lynn Aguirre 53 y.o.   Chief Complaint  Patient presents with   Annual Exam    Patient here for physical, no other concerns    HISTORY OF PRESENT ILLNESS: This is a 53 y.o. female here for annual exam. Has no complaints or medical concerns today.  HPI   Prior to Admission medications   Medication Sig Start Date End Date Taking? Authorizing Provider  fluorouracil  (EFUDEX ) 5 % cream Apply topically 2 (two) times daily. Patient not taking: Reported on 01/23/2024 01/16/23   Magdalen Pasco RAMAN, DPM  terbinafine  (LAMISIL ) 250 MG tablet Take 1 tablet (250 mg total) by mouth daily. Patient not taking: Reported on 01/23/2024 01/02/23   Purcell Emil Schanz, MD    No Known Allergies  Patient Active Problem List   Diagnosis Date Noted   Other allergic rhinitis 02/24/2020   Gastroesophageal reflux disease 01/29/2017   Seasonal allergies 01/29/2017    Past Medical History:  Diagnosis Date   Allergy     Past Surgical History:  Procedure Laterality Date   CESAREAN SECTION  2002   CESAREAN SECTION      Social History   Socioeconomic History   Marital status: Married    Spouse name: Not on file   Number of children: 4   Years of education: Not on file   Highest education level: 9th grade  Occupational History   Not on file  Tobacco Use   Smoking status: Never   Smokeless tobacco: Never  Vaping Use   Vaping status: Never Used  Substance and Sexual Activity   Alcohol use: No   Drug use: No   Sexual activity: Yes    Birth control/protection: None, Condom  Other Topics Concern   Not on file  Social History Narrative   Not on file   Social Drivers of Health   Financial Resource Strain: Not on file  Food Insecurity: Not on file  Transportation Needs: No Transportation Needs (10/21/2018)   PRAPARE - Administrator, Civil Service (Medical): No    Lack of Transportation (Non-Medical): No  Physical Activity: Not on file  Stress: Not on file  Social  Connections: Unknown (08/28/2021)   Received from [facility]   Social Network    Social Network: Not on file  Intimate Partner Violence: Unknown (07/20/2021)   Received from [facility]   HITS    Physically Hurt: Not on file    Insult or Talk Down To: Not on file    Threaten Physical Harm: Not on file    Scream or Curse: Not on file    Family History  Problem Relation Age of Onset   Diabetes Father      Review of Systems  Constitutional: Negative.  Negative for chills and fever.  HENT: Negative.  Negative for congestion and sore throat.   Respiratory: Negative.  Negative for cough and shortness of breath.   Cardiovascular: Negative.  Negative for chest pain and palpitations.  Gastrointestinal:  Negative for abdominal pain, diarrhea, nausea and vomiting.  Genitourinary: Negative.  Negative for dysuria and hematuria.  Skin: Negative.  Negative for rash.  Neurological: Negative.  Negative for dizziness and headaches.  All other systems reviewed and are negative.   Today's Vitals   01/23/24 0818  BP: 128/78  Pulse: 75  Temp: 98.5 F (36.9 C)  TempSrc: Oral  SpO2: 95%  Weight: 205 lb (93 kg)  Height: 5' 1 (1.549 m)   Body mass index is 38.73 kg/m.  Physical Exam Vitals reviewed.  Constitutional:      Appearance: Normal appearance.  HENT:     Head: Normocephalic.     Right Ear: Tympanic membrane, ear canal and external ear normal.     Left Ear: Tympanic membrane, ear canal and external ear normal.     Mouth/Throat:     Mouth: Mucous membranes are moist.     Pharynx: Oropharynx is clear.  Eyes:     Extraocular Movements: Extraocular movements intact.     Conjunctiva/sclera: Conjunctivae normal.     Pupils: Pupils are equal, round, and reactive to light.  Cardiovascular:     Rate and Rhythm: Normal rate and regular rhythm.     Pulses: Normal pulses.     Heart sounds: Normal heart sounds.  Pulmonary:     Effort: Pulmonary effort is normal.     Breath  sounds: Normal breath sounds.  Abdominal:     Palpations: Abdomen is soft.     Tenderness: There is no abdominal tenderness.  Musculoskeletal:     Cervical back: No tenderness.  Lymphadenopathy:     Cervical: No cervical adenopathy.  Skin:    General: Skin is warm and dry.     Capillary Refill: Capillary refill takes less than 2 seconds.  Neurological:     General: No focal deficit present.     Mental Status: She is alert and oriented to person, place, and time.  Psychiatric:        Mood and Affect: Mood normal.        Behavior: Behavior normal.      ASSESSMENT & PLAN: Problem List Items Addressed This Visit   None Visit Diagnoses       Routine general medical examination at a health care facility    -  Primary   Relevant Orders   CBC with Differential/Platelet   Comprehensive metabolic panel with GFR   Hemoglobin A1c   Lipid panel   TSH   Vitamin B12   VITAMIN D 25 Hydroxy (Vit-D Deficiency, Fractures)     Need for vaccination       Relevant Orders   Flu vaccine trivalent PF, 6mos and older(Flulaval,Afluria,Fluarix,Fluzone)     Colon cancer screening       Relevant Orders   Cologuard     Screening for deficiency anemia       Relevant Orders   CBC with Differential/Platelet     Screening for lipoid disorders       Relevant Orders   Lipid panel     Screening for endocrine, metabolic and immunity disorder       Relevant Orders   Comprehensive metabolic panel with GFR   Hemoglobin A1c   TSH   Vitamin B12   VITAMIN D 25 Hydroxy (Vit-D Deficiency, Fractures)      Modifiable risk factors discussed with patient. Anticipatory guidance according to age provided. The following topics were also discussed: Social Determinants of Health Smoking.  Non-smoker Diet and nutrition Benefits of exercise Cancer screening and need for colon cancer screening.  Chooses Cologuard. Review of recent mammogram report done earlier this year Vaccinations review and  recommendations Cardiovascular risk assessment and need for blood work Mental health including depression and anxiety Fall and accident prevention  Patient Instructions  Mantenimiento de Radiographer, therapeutic en las [employer] Maintenance, Female Adoptar un estilo de vida saludable y recibir atencin preventiva son importantes para promover la salud y Counsellor. Consulte al mdico sobre: El esquema adecuado para Sales executive  y exmenes peridicos. Cosas que puede hacer por su cuenta para prevenir enfermedades y [place] sano. Qu debo saber sobre la dieta, el peso y el ejercicio? Consuma una dieta saludable  Consuma una dieta que incluya muchas verduras, frutas, productos lcteos con bajo contenido de grasa y protenas magras. No consuma muchos alimentos ricos en grasas slidas, azcares agregados o sodio. Mantenga un peso saludable El ndice de masa muscular [place] [facility]) se cocos (keeling) islands para identificar problemas de [place]. Proporciona una estimacin de la grasa corporal basndose en el peso y la altura. Su mdico puede ayudarle a determinar su IMC y a Personnel officer o Pharmacologist un peso saludable. Haga ejercicio con regularidad Haga ejercicio con regularidad. Esta es una de las prcticas ms importantes que puede hacer por su salud. La [employer] de los adultos deben seguir estas pautas: Education officer, environmental, al menos, 150 minutos de actividad fsica por semana. El ejercicio debe aumentar la frecuencia cardaca y Media planner transpirar (ejercicio de intensidad moderada). Hacer ejercicios de fortalecimiento por lo [employer] por semana. Agregue esto a su plan de ejercicio de intensidad moderada. Pase menos tiempo sentada. Incluso la actividad fsica ligera puede ser beneficiosa. Controle sus niveles de colesterol y lpidos en la sangre Comience a realizarse anlisis de lpidos y Oncologist en la sangre a los 20 aos y luego reptalos cada 5 aos. Hgase controlar los niveles de colesterol con mayor frecuencia si: Sus niveles  de lpidos y colesterol son altos. Es mayor de 40 aos. Presenta un alto riesgo de padecer enfermedades cardacas. Qu debo saber sobre las pruebas de deteccin del cncer? Segn su historia clnica y sus antecedentes familiares, es posible que deba realizarse pruebas de deteccin del cncer en diferentes edades. Esto puede incluir pruebas de deteccin de lo siguiente: Cncer de mama. Cncer de cuello uterino. Cncer colorrectal. Cncer de piel. Cncer de pulmn. Qu debo saber sobre la enfermedad cardaca, la diabetes y la hipertensin arterial? Presin arterial y enfermedad cardaca La hipertensin arterial causa enfermedades cardacas y lesotho el riesgo de accidente cerebrovascular. Es ms probable que esto se manifieste en las personas que tienen lecturas de presin arterial alta o tienen sobrepeso. Hgase controlar la presin arterial: Cada 3 a 5 aos si tiene entre 18 y 72 aos. Todos los aos si es mayor de 40 aos. Diabetes Realcese exmenes de deteccin de la diabetes con regularidad. Este anlisis revisa el nivel de azcar en la sangre en [place]. Hgase las pruebas de deteccin: Cada tres aos despus de los 40 aos de edad si tiene un peso normal y un bajo riesgo de padecer diabetes. Con ms frecuencia y a partir de [place] edad inferior si tiene sobrepeso o un alto riesgo de padecer diabetes. Qu debo saber sobre la prevencin de infecciones? Hepatitis B Si tiene un riesgo ms alto de contraer hepatitis B, debe someterse a un examen de deteccin de este virus. Hable con el mdico para averiguar si tiene riesgo de contraer la infeccin por hepatitis B. Hepatitis C Se recomienda el anlisis a: [employer] 1945 y 1965. Todas las personas que tengan un riesgo de haber contrado hepatitis C. Enfermedades de transmisin sexual (ETS) Hgase las pruebas de Airline pilot de ITS, incluidas la gonorrea y la clamidia, si: Es sexualmente activa y es menor de [address]. Es  mayor de [address], y Public affairs consultant informa que corre riesgo de tener este tipo de infecciones. La actividad sexual ha cambiado desde que le hicieron la ltima prueba de deteccin y tiene un riesgo  mayor de tener clamidia o gonorrea. Pregntele al mdico si usted tiene riesgo. Pregntele al mdico si usted tiene un alto riesgo de Primary school teacher VIH. El mdico tambin puede recomendarle un medicamento recetado para ayudar a evitar la infeccin por el VIH. Si elige tomar medicamentos para prevenir el VIH, primero debe [employer] de deteccin del VIH. Luego debe hacerse anlisis cada 3 meses mientras est tomando los medicamentos. Embarazo Si est por dejar de menstruar (fase premenopusica) y usted puede quedar embarazada, busque asesoramiento antes de quedar embarazada. Tome de 400 a 800 microgramos (mcg) de cido flico todos los das si queda embarazada. Pida mtodos de control de la natalidad (anticonceptivos) si desea evitar un embarazo no deseado. Osteoporosis y rwanda La osteoporosis es una enfermedad en la que los huesos pierden los minerales y la fuerza por el avance de la edad. El resultado pueden ser fracturas en los [place]. Si tiene 65 aos o ms, o si est en riesgo de sufrir osteoporosis y fracturas, pregunte a su mdico si debe: Hacerse pruebas de deteccin de prdida sea. Tomar un suplemento de calcio o de vitamina D para reducir el riesgo de fracturas. Recibir terapia de reemplazo hormonal (TRH) para tratar los sntomas de la menopausia. Siga estas indicaciones en su casa: Consumo de alcohol No beba alcohol si: Su mdico le indica no hacerlo. Est embarazada, puede estar embarazada o est tratando de quedar embarazada. Si bebe alcohol: Limite la cantidad que bebe a lo siguiente: De 0 a 1 bebida por da. Sepa cunta cantidad de alcohol hay en las bebidas que toma. En los [address], una medida equivale a una botella de cerveza de 12 oz (355 ml), un vaso de vino de 5 oz  (148 ml) o un vaso de una bebida alcohlica de alta graduacin de 1 oz (44 ml). Estilo de vida No consuma ningn producto que contenga nicotina o tabaco. Estos productos incluyen cigarrillos, tabaco para Theatre manager y aparatos de vapeo, como los cigarrillos electrnicos. Si necesita ayuda para dejar de consumir estos productos, consulte al mdico. No consuma drogas. No comparta agujas. Solicite ayuda a su mdico si necesita apoyo o informacin para abandonar las drogas. Indicaciones generales Realcese los estudios de rutina de [address], dentales y de Wellsite geologist. Mantngase al da con las vacunas. Infrmele a su mdico si: Se siente deprimida con frecuencia. Alguna vez ha sido vctima de maltrato o no se siente seguro en su casa. Resumen Adoptar un estilo de vida saludable y recibir atencin preventiva son importantes para promover la salud y Counsellor. Siga las instrucciones del mdico acerca de una dieta saludable, el ejercicio y la realizacin de pruebas o exmenes para Hotel manager. Siga las instrucciones del mdico con respecto al control del colesterol y la presin arterial. Esta informacin no tiene Theme park manager el consejo del mdico. Asegrese de hacerle al mdico cualquier pregunta que tenga. Document Revised: 09/08/2020 Document Reviewed: 09/08/2020 Elsevier Patient Education  2024 Elsevier Inc.      Emil Schaumann, MD [facility]
